# Patient Record
Sex: Female | Born: 1960 | Race: White | Hispanic: No | Marital: Married | Smoking: Never smoker
Health system: Southern US, Community
[De-identification: ages and names within clinical notes are randomized; demographics above are authoritative.]

## PROBLEM LIST (undated history)

## (undated) ENCOUNTER — Emergency Department (HOSPITAL_COMMUNITY): Admission: EM | Payer: Managed Care, Other (non HMO) | Source: Home / Self Care

## (undated) DIAGNOSIS — M1990 Unspecified osteoarthritis, unspecified site: Secondary | ICD-10-CM

## (undated) DIAGNOSIS — N979 Female infertility, unspecified: Secondary | ICD-10-CM

## (undated) DIAGNOSIS — E559 Vitamin D deficiency, unspecified: Secondary | ICD-10-CM

## (undated) DIAGNOSIS — T7840XA Allergy, unspecified, initial encounter: Secondary | ICD-10-CM

## (undated) DIAGNOSIS — N809 Endometriosis, unspecified: Secondary | ICD-10-CM

## (undated) DIAGNOSIS — G43909 Migraine, unspecified, not intractable, without status migrainosus: Secondary | ICD-10-CM

## (undated) HISTORY — DX: Unspecified osteoarthritis, unspecified site: M19.90

## (undated) HISTORY — PX: FOOT SURGERY: SHX648

## (undated) HISTORY — DX: Female infertility, unspecified: N97.9

## (undated) HISTORY — PX: LAPAROSCOPY: SHX197

## (undated) HISTORY — DX: Migraine, unspecified, not intractable, without status migrainosus: G43.909

## (undated) HISTORY — PX: COLONOSCOPY: SHX174

## (undated) HISTORY — DX: Vitamin D deficiency, unspecified: E55.9

## (undated) HISTORY — DX: Allergy, unspecified, initial encounter: T78.40XA

## (undated) HISTORY — DX: Endometriosis, unspecified: N80.9

---

## 1986-11-27 HISTORY — PX: HYDROCELE EXCISION: SHX482

## 1999-03-04 ENCOUNTER — Other Ambulatory Visit: Admission: RE | Admit: 1999-03-04 | Discharge: 1999-03-04 | Payer: Self-pay | Admitting: Obstetrics and Gynecology

## 2000-04-30 ENCOUNTER — Encounter: Payer: Self-pay | Admitting: Obstetrics and Gynecology

## 2000-04-30 ENCOUNTER — Ambulatory Visit (HOSPITAL_COMMUNITY): Admission: RE | Admit: 2000-04-30 | Discharge: 2000-04-30 | Payer: Self-pay | Admitting: Obstetrics and Gynecology

## 2000-06-18 ENCOUNTER — Ambulatory Visit (HOSPITAL_COMMUNITY): Admission: RE | Admit: 2000-06-18 | Discharge: 2000-06-18 | Payer: Self-pay | Admitting: Obstetrics and Gynecology

## 2001-05-22 ENCOUNTER — Encounter: Payer: Self-pay | Admitting: Family Medicine

## 2001-05-22 ENCOUNTER — Encounter: Admission: RE | Admit: 2001-05-22 | Discharge: 2001-05-22 | Payer: Self-pay | Admitting: Family Medicine

## 2002-04-11 ENCOUNTER — Encounter: Admission: RE | Admit: 2002-04-11 | Discharge: 2002-04-11 | Payer: Self-pay | Admitting: Obstetrics and Gynecology

## 2002-04-11 ENCOUNTER — Encounter: Payer: Self-pay | Admitting: Obstetrics and Gynecology

## 2003-07-21 ENCOUNTER — Encounter: Admission: RE | Admit: 2003-07-21 | Discharge: 2003-07-21 | Payer: Self-pay | Admitting: Obstetrics and Gynecology

## 2003-07-21 ENCOUNTER — Encounter: Payer: Self-pay | Admitting: Obstetrics and Gynecology

## 2003-07-23 ENCOUNTER — Other Ambulatory Visit: Admission: RE | Admit: 2003-07-23 | Discharge: 2003-07-23 | Payer: Self-pay | Admitting: Obstetrics and Gynecology

## 2003-08-06 ENCOUNTER — Encounter: Admission: RE | Admit: 2003-08-06 | Discharge: 2003-08-06 | Payer: Self-pay | Admitting: Obstetrics and Gynecology

## 2003-08-06 ENCOUNTER — Encounter: Payer: Self-pay | Admitting: Obstetrics and Gynecology

## 2004-03-04 ENCOUNTER — Encounter: Admission: RE | Admit: 2004-03-04 | Discharge: 2004-03-04 | Payer: Self-pay | Admitting: Obstetrics and Gynecology

## 2004-08-25 ENCOUNTER — Other Ambulatory Visit: Admission: RE | Admit: 2004-08-25 | Discharge: 2004-08-25 | Payer: Self-pay | Admitting: Obstetrics and Gynecology

## 2004-09-02 ENCOUNTER — Encounter: Admission: RE | Admit: 2004-09-02 | Discharge: 2004-09-02 | Payer: Self-pay | Admitting: Obstetrics and Gynecology

## 2005-05-29 ENCOUNTER — Ambulatory Visit: Payer: Self-pay | Admitting: Family Medicine

## 2005-10-27 ENCOUNTER — Other Ambulatory Visit: Admission: RE | Admit: 2005-10-27 | Discharge: 2005-10-27 | Payer: Self-pay | Admitting: Obstetrics and Gynecology

## 2006-01-26 ENCOUNTER — Ambulatory Visit (HOSPITAL_COMMUNITY): Admission: RE | Admit: 2006-01-26 | Discharge: 2006-01-26 | Payer: Self-pay | Admitting: Obstetrics and Gynecology

## 2006-06-25 ENCOUNTER — Ambulatory Visit: Payer: Self-pay | Admitting: Family Medicine

## 2007-08-09 DIAGNOSIS — J309 Allergic rhinitis, unspecified: Secondary | ICD-10-CM | POA: Insufficient documentation

## 2008-03-23 ENCOUNTER — Encounter: Payer: Self-pay | Admitting: Family Medicine

## 2012-02-21 LAB — HM PAP SMEAR: HM Pap smear: NEGATIVE

## 2012-02-23 ENCOUNTER — Other Ambulatory Visit: Payer: Self-pay | Admitting: Obstetrics and Gynecology

## 2012-02-23 ENCOUNTER — Other Ambulatory Visit: Payer: Self-pay | Admitting: Certified Nurse Midwife

## 2012-02-23 DIAGNOSIS — E041 Nontoxic single thyroid nodule: Secondary | ICD-10-CM

## 2012-02-23 DIAGNOSIS — Z1231 Encounter for screening mammogram for malignant neoplasm of breast: Secondary | ICD-10-CM

## 2012-02-27 ENCOUNTER — Ambulatory Visit
Admission: RE | Admit: 2012-02-27 | Discharge: 2012-02-27 | Disposition: A | Discharge status: Home / Self Care | Payer: BC Managed Care – PPO | Source: Ambulatory Visit | Attending: Certified Nurse Midwife | Admitting: Certified Nurse Midwife

## 2012-02-27 DIAGNOSIS — E041 Nontoxic single thyroid nodule: Secondary | ICD-10-CM

## 2012-03-19 ENCOUNTER — Ambulatory Visit: Payer: Self-pay

## 2012-04-09 ENCOUNTER — Ambulatory Visit: Payer: Self-pay

## 2012-04-26 ENCOUNTER — Ambulatory Visit
Admission: RE | Admit: 2012-04-26 | Discharge: 2012-04-26 | Disposition: A | Discharge status: Home / Self Care | Payer: BC Managed Care – PPO | Source: Ambulatory Visit | Attending: Obstetrics and Gynecology | Admitting: Obstetrics and Gynecology

## 2012-04-26 DIAGNOSIS — Z1231 Encounter for screening mammogram for malignant neoplasm of breast: Secondary | ICD-10-CM

## 2012-10-10 ENCOUNTER — Telehealth: Payer: Self-pay | Admitting: *Deleted

## 2012-10-10 NOTE — Telephone Encounter (Signed)
Pt reports for 3 weeks, she has experienced diarrhea at first, but now her stools are very thin/liquid like, almost watery; she has not seen any blood. She states this is an abrupt change for her. She reports a "pain/discomfort/gurgling" in her stomach after eating and sometimes urgency and bloating. This feeling is below her belly button in the middle of her stomach. I asked if she needs to be seen by an extender or can she wait until next week and she stated she can wait, she's just worried because it is a sudden change for her. Pt given an appt on 10/17/12 to see Dr Rhea Belton.

## 2012-10-10 NOTE — Telephone Encounter (Signed)
lmom for pt to call back. She requests an appt for a change in bowel habits and abdominal pain.

## 2012-10-16 ENCOUNTER — Encounter: Payer: Self-pay | Admitting: Internal Medicine

## 2012-10-17 ENCOUNTER — Encounter: Payer: Self-pay | Admitting: Internal Medicine

## 2012-10-17 ENCOUNTER — Ambulatory Visit (INDEPENDENT_AMBULATORY_CARE_PROVIDER_SITE_OTHER): Payer: BC Managed Care – PPO | Admitting: Internal Medicine

## 2012-10-17 ENCOUNTER — Other Ambulatory Visit (INDEPENDENT_AMBULATORY_CARE_PROVIDER_SITE_OTHER): Payer: BC Managed Care – PPO

## 2012-10-17 VITALS — BP 106/74 | HR 72 | Ht 64.25 in | Wt 168.1 lb

## 2012-10-17 DIAGNOSIS — R194 Change in bowel habit: Secondary | ICD-10-CM

## 2012-10-17 DIAGNOSIS — R198 Other specified symptoms and signs involving the digestive system and abdomen: Secondary | ICD-10-CM

## 2012-10-17 DIAGNOSIS — Z8669 Personal history of other diseases of the nervous system and sense organs: Secondary | ICD-10-CM | POA: Insufficient documentation

## 2012-10-17 DIAGNOSIS — R197 Diarrhea, unspecified: Secondary | ICD-10-CM

## 2012-10-17 DIAGNOSIS — R109 Unspecified abdominal pain: Secondary | ICD-10-CM

## 2012-10-17 LAB — COMPREHENSIVE METABOLIC PANEL
AST: 15 U/L (ref 0–37)
Albumin: 4.4 g/dL (ref 3.5–5.2)
Alkaline Phosphatase: 62 U/L (ref 39–117)
Glucose, Bld: 91 mg/dL (ref 70–99)
Potassium: 4.4 mEq/L (ref 3.5–5.1)
Sodium: 138 mEq/L (ref 135–145)
Total Protein: 7.6 g/dL (ref 6.0–8.3)

## 2012-10-17 LAB — CBC WITH DIFFERENTIAL/PLATELET
Eosinophils Relative: 2.3 % (ref 0.0–5.0)
Lymphocytes Relative: 32.9 % (ref 12.0–46.0)
MCV: 95.2 fl (ref 78.0–100.0)
Monocytes Absolute: 0.5 10*3/uL (ref 0.1–1.0)
Neutrophils Relative %: 55.7 % (ref 43.0–77.0)
Platelets: 223 10*3/uL (ref 150.0–400.0)
WBC: 5.2 10*3/uL (ref 4.5–10.5)

## 2012-10-17 LAB — TSH: TSH: 1.68 u[IU]/mL (ref 0.35–5.50)

## 2012-10-17 MED ORDER — DIPHENOXYLATE-ATROPINE 2.5-0.025 MG PO TABS: 1.0000 | ORAL_TABLET | Freq: Four times a day (QID) | ORAL | Status: DC | PRN

## 2012-10-17 MED ORDER — PEG-KCL-NACL-NASULF-NA ASC-C 100 G PO SOLR: 1.0000 | Freq: Once | ORAL | Status: DC

## 2012-10-17 NOTE — Patient Instructions (Addendum)
You have been scheduled for a colonoscopy with propofol. Please follow written instructions given to you at your visit today.  Please pick up your prep kit at the pharmacy within the next 1-3 days. If you use inhalers (even only as needed) or a CPAP machine, please bring them with you on the day of your procedure.  We have sent the following medications to your pharmacy for you to pick up at your convenience: moviprep, lomotil  Your physician has requested that you go to the basement for the following lab work before leaving today: Stool studies, CBC, CMP, TSH, Celiac

## 2012-10-17 NOTE — Progress Notes (Signed)
Patient ID: Lisa Rice, female   DOB: 10-May-1961, 51 y.o.   MRN: [id number]  SUBJECTIVE: HPI Lisa Rice is a 51 yo female with PMH of migraines who is seen in clinic for evaluation of change in bowel habits and loose stools. The patient is alone today. She reports in mid October she had what is now felt to be an abrupt change in bowel habits. Initially she thought very little other change and felt it might be secondary to a viral infection. Prior to this time her bowel habits have always been regular occurring daily to every other day. She denies a history of constipation or diarrhea. Since mid-October she has noted her stools to be loose and at times watery. She also reports they have been "thinner than normal". She has occasional lower abdominal cramping which is relieved by defecation. She is having bowel movements 3-4 times daily. She feels that they have a different and stronger odor. She's also noted some abdominal bloating. She denies rectal bleeding and melena. No nausea or vomiting. Appetite has been good. Sleep has been poor lately and she has been worried about this change. Menstrual cycles have been regular. No mouth ulcers, joint pains, rashes. No travel outside of the [place], no camping or well water.  Review of Systems  As per history of present illness, otherwise negative   Past Medical History  Diagnosis Date  . Migraine headache     Current Outpatient Prescriptions  Medication Sig Dispense Refill  . CALCIUM MAGNESIUM 750 PO Take 1 tablet by mouth daily.      Marland Kitchen KETOPROFEN PO Take 1 tablet by mouth as needed.      . loratadine-pseudoephedrine (EQ ALLERGY RELIEF D) 10-240 MG per 24 hr tablet Take 1 tablet by mouth daily.      Marland Kitchen topiramate (TOPAMAX) 25 MG tablet Take 75 mg by mouth daily.      . diphenoxylate-atropine (LOMOTIL) 2.5-0.025 MG per tablet Take 1 tablet by mouth 4 (four) times daily as needed for diarrhea or loose stools.  30 tablet  0  . peg 3350 powder (MOVIPREP) 100 G  SOLR Take 1 kit (100 g total) by mouth once.  1 kit  0    No Known Allergies  Family History  Problem Relation Age of Onset  . Breast cancer Mother   . Prostate cancer Father   . Uterine cancer Sister   . Kidney cancer Sister     History  Substance Use Topics  . Smoking status: Never Smoker   . Smokeless tobacco: Never Used  . Alcohol Use: Yes     Comment: occas- social    OBJECTIVE: BP 106/74  Pulse 72  Ht 5' 4.25" (1.632 m)  Wt 168 lb 2 oz (76.261 kg)  BMI 28.63 kg/m2  LMP 09/26/2012 Constitutional: Well-developed and well-nourished. No distress. HEENT: Normocephalic and atraumatic. Oropharynx is clear and moist. No oropharyngeal exudate. Conjunctivae are normal. No scleral icterus. Neck: Neck supple. Trachea midline. Cardiovascular: Normal rate, regular rhythm and intact distal pulses. No M/R/G Pulmonary/chest: Effort normal and breath sounds normal. No wheezing, rales or rhonchi. Abdominal: Soft, nontender, nondistended. Bowel sounds active throughout. There are no masses palpable. No hepatosplenomegaly. Extremities: no clubbing, cyanosis, or edema Lymphadenopathy: No cervical adenopathy noted. Neurological: Alert and oriented to person place and time. Skin: Skin is warm and dry. No rashes noted. Psychiatric: Normal mood and affect. Behavior is normal.   ASSESSMENT AND PLAN: 51 yo female with PMH of migraines who is seen  in clinic for evaluation of change in bowel habits and loose stools.  1.  Change in bowel habits -- I've recommended stool studies to include C. difficile, ova and parasite, fecal leukocytes, and stool culture. Also will check labs today to include TSH, celiac panel, CMP and CBC. If these are unrevealing I recommended direct visualization with colonoscopy. We discussed the test including the risks and benefits and she is agreeable to proceed. I will give her prescription for Lomotil to be used as directed and as needed for loose stools and diarrhea.  She was offered an anti-spasmodic, but does not feel this is necessary at this time. Further recommendations to be made after lab studies and colonoscopy.

## 2012-10-21 ENCOUNTER — Other Ambulatory Visit: Payer: BC Managed Care – PPO

## 2012-10-21 DIAGNOSIS — R194 Change in bowel habit: Secondary | ICD-10-CM

## 2012-10-21 DIAGNOSIS — R109 Unspecified abdominal pain: Secondary | ICD-10-CM

## 2012-10-22 LAB — CLOSTRIDIUM DIFFICILE BY PCR: Toxigenic C. Difficile by PCR: NOT DETECTED

## 2012-10-23 ENCOUNTER — Ambulatory Visit (AMBULATORY_SURGERY_CENTER): Payer: BC Managed Care – PPO | Admitting: Internal Medicine

## 2012-10-23 ENCOUNTER — Encounter: Payer: Self-pay | Admitting: Internal Medicine

## 2012-10-23 VITALS — BP 110/60 | HR 63 | Temp 97.8°F | Resp 16 | Ht 64.0 in | Wt 168.0 lb

## 2012-10-23 DIAGNOSIS — D126 Benign neoplasm of colon, unspecified: Secondary | ICD-10-CM

## 2012-10-23 DIAGNOSIS — R109 Unspecified abdominal pain: Secondary | ICD-10-CM

## 2012-10-23 DIAGNOSIS — R198 Other specified symptoms and signs involving the digestive system and abdomen: Secondary | ICD-10-CM

## 2012-10-23 DIAGNOSIS — R194 Change in bowel habit: Secondary | ICD-10-CM

## 2012-10-23 DIAGNOSIS — R195 Other fecal abnormalities: Secondary | ICD-10-CM

## 2012-10-23 MED ORDER — SODIUM CHLORIDE 0.9 % IV SOLN: 500.0000 mL | INTRAVENOUS | Status: DC

## 2012-10-23 NOTE — Progress Notes (Signed)
Patient did not experience any of the following events: a burn prior to discharge; a fall within the facility; wrong site/side/patient/procedure/implant event; or a hospital transfer or hospital admission upon discharge from the facility. (G8907) Patient did not have preoperative order for IV antibiotic SSI prophylaxis. (G8918)  

## 2012-10-23 NOTE — Op Note (Signed)
[facility] [address] [place], [address]   COLONOSCOPY PROCEDURE REPORT  PATIENT: Lisa, Rice  MR#: [id number] BIRTHDATE: 08-28-1961 , 51  yrs. old GENDER: Female ENDOSCOPIST: Beverley Fiedler, MD PROCEDURE DATE:  10/23/2012 PROCEDURE:   Colonoscopy with biopsy ASA CLASS:   Class I INDICATIONS:Change in bowel habits and unexplained loose stools. MEDICATIONS: MAC sedation, administered by CRNA and propofol (Diprivan) 500mg  IV  DESCRIPTION OF PROCEDURE:   After the risks benefits and alternatives of the procedure were thoroughly explained, informed consent was obtained.  A digital rectal exam revealed no rectal mass.   The LB CF-[id number]  endoscope was introduced through the anus and advanced to the terminal ileum which was intubated for a short distance. No adverse events experienced.   The quality of the prep was good, using MoviPrep  The instrument was then slowly withdrawn as the colon was fully examined.    COLON FINDINGS: The mucosa appeared normal in the terminal ileum. A normal appearing cecum, ileocecal valve, and appendiceal orifice were identified.  The ascending, hepatic flexure, transverse, splenic flexure, descending, sigmoid colon and rectum appeared unremarkable.  No polyps or cancers were seen.  Multiple random biopsies were performed throughout the colon given loose stools. Retroflexed views revealed no abnormalities. The time to cecum=5 minutes 22 seconds.  Withdrawal time=14 minutes 55 seconds.  The scope was withdrawn and the procedure completed.  COMPLICATIONS: There were no complications.  ENDOSCOPIC IMPRESSION: 1.   Normal mucosa in the terminal ileum 2.   Normal colon; multiple random biopsies performed  RECOMMENDATIONS: 1.  Await pathology results 2.  Trial of Align 1 capsule daily   eSigned:  Beverley Fiedler, MD 10/23/2012 8:20 AM   cc: Nelwyn Salisbury, MD and The Patient

## 2012-10-23 NOTE — Patient Instructions (Signed)
Normal colonoscopy Recommendations:  Trial of Align 1 capsule daily  YOU HAD AN ENDOSCOPIC PROCEDURE TODAY AT THE [facility]: Refer to the procedure report that was given to you for any specific questions about what was found during the examination.  If the procedure report does not answer your questions, please call your gastroenterologist to clarify.  If you requested that your care partner not be given the details of your procedure findings, then the procedure report has been included in a sealed envelope for you to review at your convenience later.  YOU SHOULD EXPECT: Some feelings of bloating in the abdomen. Passage of more gas than usual.  Walking can help get rid of the air that was put into your GI tract during the procedure and reduce the bloating. If you had a lower endoscopy (such as a colonoscopy or flexible sigmoidoscopy) you may notice spotting of blood in your stool or on the toilet paper. If you underwent a bowel prep for your procedure, then you may not have a normal bowel movement for a few days.  DIET: Your first meal following the procedure should be a light meal and then it is ok to progress to your normal diet.  A half-sandwich or bowl of soup is an example of a good first meal.  Heavy or fried foods are harder to digest and may make you feel nauseous or bloated.  Likewise meals heavy in dairy and vegetables can cause extra gas to form and this can also increase the bloating.  Drink plenty of fluids but you should avoid alcoholic beverages for 24 hours.  ACTIVITY: Your care partner should take you home directly after the procedure.  You should plan to take it easy, moving slowly for the rest of the day.  You can resume normal activity the day after the procedure however you should NOT DRIVE or use heavy machinery for 24 hours (because of the sedation medicines used during the test).    SYMPTOMS TO REPORT IMMEDIATELY: A gastroenterologist can be reached at any hour.   During normal business hours, 8:30 AM to 5:00 PM Monday through Friday, call [phone].  After hours and on weekends, please call the GI answering service at [phone] who will take a message and have the physician on call contact you.   Following lower endoscopy (colonoscopy or flexible sigmoidoscopy):  Excessive amounts of blood in the stool  Significant tenderness or worsening of abdominal pains  Swelling of the abdomen that is new, acute  Fever of 100F or higher  Following upper endoscopy (EGD)  Vomiting of blood or coffee ground material  New chest pain or pain under the shoulder blades  Painful or persistently difficult swallowing  New shortness of breath  Fever of 100F or higher  Black, tarry-looking stools  FOLLOW UP: If any biopsies were taken you will be contacted by phone or by letter within the next 1-3 weeks.  Call your gastroenterologist if you have not heard about the biopsies in 3 weeks.  Our staff will call the home number listed on your records the next business day following your procedure to check on you and address any questions or concerns that you may have at that time regarding the information given to you following your procedure. This is a courtesy call and so if there is no answer at the home number and we have not heard from you through the emergency physician on call, we will assume that you have returned to your  regular daily activities without incident.  SIGNATURES/CONFIDENTIALITY: You and/or your care partner have signed paperwork which will be entered into your electronic medical record.  These signatures attest to the fact that that the information above on your After Visit Summary has been reviewed and is understood.  Full responsibility of the confidentiality of this discharge information lies with you and/or your care-partner.   Please follow all discharge instructions given to you by the recovery room nurse. If you have any questions or  problems after discharge please call one of the numbers listed above. You will receive a phone call in the am to see how you are doing and answer any questions you may have. Thank you for choosing [facility] for your health care needs.  

## 2012-10-28 ENCOUNTER — Telehealth: Payer: Self-pay | Admitting: *Deleted

## 2012-10-28 NOTE — Telephone Encounter (Signed)
  Follow up Call-  Call back number 10/23/2012  Post procedure Call Back phone  # cell [phone]  Permission to leave phone message Yes     Patient questions:  Do you have a fever, pain , or abdominal swelling? no Pain Score  0 *  Have you tolerated food without any problems? yes  Have you been able to return to your normal activities? yes  Do you have any questions about your discharge instructions: Diet   no Medications  no Follow up visit  no  Do you have questions or concerns about your Care? no  Actions: * If pain score is 4 or above: No action needed, pain <4.

## 2012-11-04 ENCOUNTER — Encounter: Payer: Self-pay | Admitting: Internal Medicine

## 2012-11-08 ENCOUNTER — Telehealth: Payer: Self-pay | Admitting: Genetic Counselor

## 2012-11-08 NOTE — Telephone Encounter (Signed)
S/W referring office in re genetic appt 12/23 @ 1o w/,Karen Lowell [employer]uitar.  Welcome packet mailed.

## 2012-11-18 ENCOUNTER — Ambulatory Visit (HOSPITAL_BASED_OUTPATIENT_CLINIC_OR_DEPARTMENT_OTHER): Payer: BC Managed Care – PPO | Admitting: Genetic Counselor

## 2012-11-18 ENCOUNTER — Encounter: Payer: Self-pay | Admitting: Genetic Counselor

## 2012-11-18 ENCOUNTER — Other Ambulatory Visit: Payer: BC Managed Care – PPO | Admitting: Lab

## 2012-11-18 DIAGNOSIS — Z803 Family history of malignant neoplasm of breast: Secondary | ICD-10-CM

## 2012-11-18 DIAGNOSIS — Z8049 Family history of malignant neoplasm of other genital organs: Secondary | ICD-10-CM

## 2012-11-18 DIAGNOSIS — IMO0002 Reserved for concepts with insufficient information to code with codable children: Secondary | ICD-10-CM

## 2012-11-18 NOTE — Progress Notes (Signed)
Lisa Rice, CNW requested a consultation for genetic counseling and risk assessment for Lisa Rice, a 51 y.o. female, for discussion of her family history of breast, prostate, kidney and uterine cancer. She presents to clinic today to discuss the possibility of a genetic predisposition to cancer, and to further clarify her risks, as well as her family members' risks for cancer.   HISTORY OF PRESENT ILLNESS: Lisa Rice is a 51 y.o. female with no personal history of cancer.    Past Medical History  Diagnosis Date  . Migraine headache     Past Surgical History  Procedure Date  . Hydrocele excision 1988    hernia, right lower abd  . Laparoscopy     History  Substance Use Topics  . Smoking status: Never Smoker   . Smokeless tobacco: Never Used  . Alcohol Use: Yes     Comment: occas- social    REPRODUCTIVE HISTORY AND PERSONAL RISK ASSESSMENT FACTORS: Menarche was at age 58-14.   Premenopausal Uterus Intact: Yes Ovaries Intact: only has one ovary G0P0A0 , first live birth at age N/A  She has previously undergone treatment for infertility.   OCP use for 9 years   She has not used HRT in the past.   Colonoscopy at age 41 was negative for polyps, diverticulitis or malignancy.  FAMILY HISTORY:  We obtained a detailed, 4-generation family history.  Significant diagnoses are listed below: Family History  Problem Relation Age of Onset  . Breast cancer Mother 25  . Prostate cancer Father 22  . Uterine cancer Sister 39  . Kidney cancer Sister 40  The patient was diagnosed with thyroid nodules at the age of 64. The patient has nine full brothers and sisters and one paternal half brother.  Her oldest sister was diagnosed with both uterine and kidney cancer at the same time at age 62.  One brother committed suicide at age 5.  The patients father was diagnosed with prostate cancer at age 77 and died at age 12 from metastasizes.  The patient's mother was diagnosed with breast  cancer at age 46 and died at 74.  She has three brothers and a sister who died in their late 25s and 49s without cancer.  There is no other reported cancer history on either side of the family.  Patient's maternal ancestors are of [place] descent, and paternal ancestors are of [place] descent. There is no reported Ashkenazi Jewish ancestry. There is no  known consanguinity.  GENETIC COUNSELING RISK ASSESSMENT, DISCUSSION, AND SUGGESTED FOLLOW UP: We reviewed the natural history and genetic etiology of sporadic, familial and hereditary cancer syndromes.  About 5-10% of breast cancer is hereditary.  Of this, about 85% is the result of a BRCA1 or BRCA2 mutation.  We reviewed the red flags of hereditary cancer syndromes and the dominant inheritance patterns.  If the BRCA testing is negative, we discussed that we could be testing for the wrong gene.  We discussed gene panels, and that several cancer genes that are associated with different cancers can be tested at the same time.  Because of the different types of cancer that are in the patient's family, we will consider the [facility] panel testing.   The patient's family history of breast, prostate, uterine and kidney cancer is suggestive of the following possible diagnosis: hereditary cancer syndrome  We discussed that identification of a hereditary cancer syndrome may help her care providers tailor the patients medical management. If a mutation indicating a  hereditary cancer syndroem is detected in this case, the [employer] recommendations would include increased cancer surveillance and possible prophylactic surgery. If a mutation is detected, the patient will be referred back to the referring provider and to any additional appropriate care providers to discuss the relevant options.   If a mutation is not found in the patient, cancer surveillance options would be discussed for the patient according to the appropriate  standard National Comprehensive Cancer Network and American Cancer Society guidelines, with consideration of their personal and family history risk factors. In this case, the patient will be referred back to their care providers for discussions of management.   In order to estimate her chance of having a BRCA mutation, we used statistical models (BRACAPro, Tyrer Cusik and PENN II) and laboratory data that take into account her personal medical history, family history and ancestry.  Because each model is different, there can be a lot of variability in the risks they give.  Therefore, these numbers must be considered a rough range and not a precise risk of having a BRCA mutation.  These models estimate that she has approximately a 0.03-8% chance of having a mutation. Based on this assessment of her family and personal history, genetic testing is recommended.  Based on the patient's personal and family history, statistical models (BRCAPro and [employer]) and literature data were used to estimate her risk of developing breast cancer. These estimate her lifetime risk of developing breast cancer to be approximately 10.5% to 19.2%. This estimation does not take into account any genetic testing results.   After considering the risks, benefits, and limitations, the patient provided informed consent for  the following  testing: BRACAnalysis with My[employer] through [employer].   Per the patient's request, we will contact her by telephone to discuss these results. A follow up genetic counseling visit will be scheduled if indicated.  The patient was seen for a total of 60 minutes, greater than 50% of which was spent face-to-face counseling.  This plan is being carried out per Lisa Rice, CNW recommendations.  This note will also be sent to the referring provider via the electronic medical record. The patient will be supplied with a summary of this genetic counseling discussion as well as educational information  on the discussed hereditary cancer syndromes following the conclusion of their visit.   Patient was discussed with Dr. Pierce Crane in Dr. Feliz Beam absence.   _______________________________________________________________________ For Office Staff:  Number of people involved in session: 2 Was an Intern/ student involved with case: no

## 2012-12-09 ENCOUNTER — Telehealth: Payer: Self-pay | Admitting: Genetic Counselor

## 2012-12-09 NOTE — Telephone Encounter (Signed)
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

## 2012-12-25 ENCOUNTER — Encounter: Payer: Self-pay | Admitting: Genetic Counselor

## 2013-01-13 ENCOUNTER — Encounter: Payer: Self-pay | Admitting: Certified Nurse Midwife

## 2013-02-25 ENCOUNTER — Ambulatory Visit: Payer: Self-pay | Admitting: Certified Nurse Midwife

## 2013-03-03 ENCOUNTER — Ambulatory Visit: Payer: Self-pay | Admitting: Certified Nurse Midwife

## 2013-03-13 ENCOUNTER — Encounter: Payer: Self-pay | Admitting: Certified Nurse Midwife

## 2013-03-13 ENCOUNTER — Ambulatory Visit (INDEPENDENT_AMBULATORY_CARE_PROVIDER_SITE_OTHER): Payer: BC Managed Care – PPO | Admitting: Certified Nurse Midwife

## 2013-03-13 VITALS — BP 102/60 | Ht 64.5 in | Wt 179.0 lb

## 2013-03-13 DIAGNOSIS — Z01419 Encounter for gynecological examination (general) (routine) without abnormal findings: Secondary | ICD-10-CM

## 2013-03-13 DIAGNOSIS — Z0000 Encounter for general adult medical examination without abnormal findings: Secondary | ICD-10-CM

## 2013-03-13 LAB — POCT URINALYSIS DIPSTICK
Bilirubin, UA: NEGATIVE
Glucose, UA: NEGATIVE
Ketones, UA: NEGATIVE
Leukocytes, UA: NEGATIVE

## 2013-03-13 NOTE — Progress Notes (Signed)
52 y.o. MarriedCaucasian female   G0P0 here for annual exam. Periods normal.  No changes.  Continues follow up with neurologist with migraine headache management.  Contraception now, long history of infertility. Working out now in gym to help with weight control.  No health concerns today  Patient's last menstrual period was 02/28/2013.          Sexually active: yes  The current method of family planning is none.    Exercising: yes  Gym/ health club routine includes cardio. Last mammogram: 04/26/12 normal Last pap: 02/21/12 neg Last BMD: never Alcohol: occ beer and wine Tobacco: quit 15 years ago Colonoscopy 11-13 negative, 10 years   Health Maintenance  Topic Date Due  . Influenza Vaccine  07/28/2013  . Pap Smear  02/21/2015  . Tetanus/tdap  02/20/2022    Family History  Problem Relation Age of Onset  . Breast cancer Mother 63  . Prostate cancer Father 37  . Uterine cancer Sister 64  . Kidney cancer Sister 20    Patient Active Problem List  Diagnosis  . ALLERGIC RHINITIS  . Hx of migraines    Past Medical History  Diagnosis Date  . Migraine headache   . Endometriosis   . Infertility, female     IVF 12 years ago    Past Surgical History  Procedure Laterality Date  . Hydrocele excision  1988    hernia, right lower abd  . Laparoscopy      removed left ovary    Allergies: Review of patient's allergies indicates no known allergies.  Current Outpatient Prescriptions  Medication Sig Dispense Refill  . KETOPROFEN PO Take 1 tablet by mouth as needed.      . loratadine-pseudoephedrine (EQ ALLERGY RELIEF D) 10-240 MG per 24 hr tablet Take 1 tablet by mouth daily.      Marland Kitchen topiramate (TOPAMAX) 25 MG tablet Take 75 mg by mouth daily.      Marland Kitchen zolmitriptan (ZOMIG) 5 MG nasal solution Place into the nose as needed for migraine.       No current facility-administered medications for this visit.    ROS: Pertinent items are noted in HPI.  Exam:    BP 102/60  Ht 5' 4.5"  (1.638 m)  Wt 179 lb (81.194 kg)  BMI 30.26 kg/m2  LMP 02/28/2013 Weight change: @WEIGHTCHANGE @ Last 3 height recordings:  Ht Readings from Last 3 Encounters:  03/13/13 5' 4.5" (1.638 m)  10/23/12 5\' 4"  (1.626 m)  10/17/12 5' 4.25" (1.632 m)   General appearance: alert, cooperative and appears stated age Head: Normocephalic, without obvious abnormality, atraumatic Neck: no adenopathy, supple, symmetrical, trachea midline and thyroid not enlarged, symmetric, no tenderness/mass/nodules Lungs: clear to auscultation bilaterally Breasts: normal appearance, no masses or tenderness, No nipple retraction or dimpling Heart: regular rate and rhythm Abdomen: soft, non-tender; bowel sounds normal; no masses,  no organomegaly Extremities: extremities normal, atraumatic, no cyanosis or edema Skin: Skin color, texture, turgor normal. No rashes or lesions Lymph nodes: Cervical, supraclavicular, and axillary nodes normal. no inguinal nodes palpated Neurologic: Alert and oriented X 3, normal strength and tone. Normal symmetric reflexes. Normal coordination and gait   Pelvic: External genitalia:  no lesions and well estrogenized              Urethra: normal appearing urethra with no masses, tenderness or lesions              Bartholins and Skenes: Bartholin's, Urethra, Skene's normal  Vagina: normal appearing vagina with normal color and discharge, no lesions              Cervix: normal appearance              Pap taken: yes        Bimanual Exam:  Uterus:  uterus is normal size, shape, consistency and nontender, mid position                                      Adnexa:    normal adnexa in size, nontender and no masses                                      Rectovaginal: Confirms                                      Anus:  normal sphincter tone, no lesions  A: Well woman exam Contraception not needed  Infertility History of migraine headache stable medication     P:Reviewed health and  wellness pertinent to exam  Mammogram yearly pap smear as indicated Continue follow as indicated  return annually or prn      An After Visit Summary was printed and given to the patient.   Reviewed, TL

## 2013-03-13 NOTE — Patient Instructions (Addendum)

## 2013-12-12 ENCOUNTER — Ambulatory Visit (INDEPENDENT_AMBULATORY_CARE_PROVIDER_SITE_OTHER): Payer: BC Managed Care – PPO

## 2013-12-12 ENCOUNTER — Ambulatory Visit (INDEPENDENT_AMBULATORY_CARE_PROVIDER_SITE_OTHER): Payer: BC Managed Care – PPO | Admitting: Podiatrist

## 2013-12-12 ENCOUNTER — Encounter: Payer: Self-pay | Admitting: Podiatrist

## 2013-12-12 VITALS — BP 105/59 | HR 61 | Resp 18

## 2013-12-12 DIAGNOSIS — D219 Benign neoplasm of connective and other soft tissue, unspecified: Secondary | ICD-10-CM

## 2013-12-12 DIAGNOSIS — M6740 Ganglion, unspecified site: Secondary | ICD-10-CM

## 2013-12-12 DIAGNOSIS — M79609 Pain in unspecified limb: Secondary | ICD-10-CM

## 2013-12-12 DIAGNOSIS — D3610 Benign neoplasm of peripheral nerves and autonomic nervous system, unspecified: Secondary | ICD-10-CM

## 2013-12-12 DIAGNOSIS — M7150 Other bursitis, not elsewhere classified, unspecified site: Secondary | ICD-10-CM

## 2013-12-12 NOTE — Progress Notes (Signed)
   Subjective:    Patient ID: Lisa Rice, female    DOB: 1961/10/11, 53 y.o.   MRN: [id number]  HPI both feet and I notice inbetween my 3rd and 4th toe and there is sharp pain and feels like they bend but they cant and been going on for 2 months and hurts anytime     Review of Systems  Constitutional: Negative.   HENT: Negative.   Eyes: Negative.   Respiratory: Negative.   Cardiovascular: Negative.   Gastrointestinal: Negative.   Endocrine: Negative.   Genitourinary: Negative.   Musculoskeletal:       Difficulty walking  Skin: Negative.   Neurological: Negative.   Hematological: Negative.   Psychiatric/Behavioral: Negative.        Objective:   Physical Exam GENERAL APPEARANCE: Alert, conversant. Appropriately groomed. No acute distress.  VASCULAR: Pedal pulses palpable and strong bilateral.  Capillary refill time is immediate to all digits,  Proximal to distal cooling it warm to warm.  Digital hair growth is present bilateral  NEUROLOGIC: sensation is intact epicritically and protectively to 5.07 monofilament at 5/5 sites bilateral.  Light touch is intact bilateral, vibratory sensation intact bilateral, achilles tendon reflex is intact bilateral. Neuroma type symptomotology noted 3rd interspace bilateral feet.  No palpapable click appreciated bilateral.  MUSCULOSKELETAL: acceptable muscle strength, tone and stability bilateral. Swelling noted 3rd interspace bilateral.  Splaying of the digits seen 3/4 bilateral DERMATOLOGIC: isolated swelling identified at the 3rd interspace bilaterally.  It does appear to be localized swelling versus cyst in the area.         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  Kenalog and marcaine mixture was infiltrated.  Patient tolerated this well.  i will see her back in 3-4 weeks for follow up and to see how effective the  injection was for her.  If any concerns arise she is to call.

## 2013-12-12 NOTE — Patient Instructions (Signed)
Morton's Neuroma Neuralgia (nerve pain) or neuroma (benign [non-cancerous] nerve tumor) may develop on any interdigital nerve. The interdigital nerves (nerves between digits) of the foot travel beneath and between the metatarsals (long bones of the fore foot) and pass the nerve endings to the toes. The third interdigital is a common place for a small neuroma to form called Morton's neuroma. Another nerve to be affected commonly is the fourth interdigital nerve. This would be in approximately in the area of the base or ball under the bottom of your fourth toe. This condition occurs more commonly in women and is usually on one side. It is usually first noticed by pain radiating (spreading) to the ball of the foot or to the toes. CAUSES The cause of interdigital neuralgia may be from low grade repetitive trauma (damage caused by an accident) as in activities causing a repeated pounding of the foot (running, jumping etc.). It is also caused by improper footwear or recent loss of the fatty padding on the bottom of the foot. TREATMENT  The condition often resolves (goes away) simply with decreasing activity if that is thought to be the cause. Proper shoes are beneficial. Orthotics (special foot support aids) such as a metatarsal bar are often beneficial. This condition usually responds to conservative therapy, however if surgery is necessary it usually brings complete relief. HOME CARE INSTRUCTIONS   Apply ice to the area of soreness for 15-20 minutes, 03-04 times per day, while awake for the first 2 days. Put ice in a plastic bag and place a towel between the bag of ice and your skin.  Only take over-the-counter or prescription medicines for pain, discomfort, or fever as directed by your caregiver. MAKE SURE YOU:   Understand these instructions.  Will watch your condition.  Will get help right away if you are not doing well or get worse. Document Released: 02/19/2001 Document Revised: 02/05/2012  Document Reviewed: 11/13/2005 [employer] Patient Information 2014 [employer].  

## 2014-01-09 ENCOUNTER — Ambulatory Visit (INDEPENDENT_AMBULATORY_CARE_PROVIDER_SITE_OTHER): Payer: BC Managed Care – PPO | Admitting: Podiatrist

## 2014-01-09 ENCOUNTER — Encounter: Payer: Self-pay | Admitting: Podiatrist

## 2014-01-09 VITALS — BP 104/67 | HR 64 | Resp 12

## 2014-01-09 DIAGNOSIS — M6740 Ganglion, unspecified site: Secondary | ICD-10-CM

## 2014-01-09 DIAGNOSIS — M7150 Other bursitis, not elsewhere classified, unspecified site: Secondary | ICD-10-CM

## 2014-01-09 NOTE — Progress Notes (Deleted)
''   BOTH FEET ARE STILL HURTING AND THE TOES STILL FEEL DULL PAIN.''

## 2014-01-09 NOTE — Progress Notes (Signed)
   Subjective:  Lisa Rice presents today for follow up of pain 3rd interspace bilateral.  At the last visit we performed a cyst drainage and a steroid injection bilateral feet.  She relates improvement but not complete resolvement of symptoms.  She relates a dull pain bilateral 3rd interspaces. And movement of the discomfort to the toes themselves.    Objective:  Neurovascular status unchanged and intact.  Dull discomfort is present 3rd interspace bilateral.  Palpable fullness and inflammation in the interspaces is seen.  No palpable click is present,    Assessment:  Cyst 3rd interspace bilateral  Plan:  Injected the 3rd interspaces with kenalog and marcaine mix was carried out today.  She will notify me if the symptoms do no continue to improve.  Will consider mri for evaluation.

## 2014-03-18 ENCOUNTER — Telehealth: Payer: Self-pay | Admitting: Certified Nurse Midwife

## 2014-03-19 ENCOUNTER — Ambulatory Visit: Payer: BC Managed Care – PPO | Admitting: Certified Nurse Midwife

## 2014-04-14 ENCOUNTER — Encounter: Payer: Self-pay | Admitting: Certified Nurse Midwife

## 2014-05-14 ENCOUNTER — Ambulatory Visit: Payer: BC Managed Care – PPO | Admitting: Certified Nurse Midwife

## 2014-05-26 ENCOUNTER — Ambulatory Visit: Payer: BC Managed Care – PPO | Admitting: Certified Nurse Midwife

## 2014-07-09 ENCOUNTER — Telehealth: Payer: Self-pay | Admitting: Family Medicine

## 2014-07-09 NOTE — Telephone Encounter (Signed)
Pt would like to know if you will accept her as a pt? Pt's husband sees you and pt has been having headaches.

## 2014-08-20 ENCOUNTER — Encounter: Payer: Self-pay | Admitting: Certified Nurse Midwife

## 2014-08-20 ENCOUNTER — Ambulatory Visit (INDEPENDENT_AMBULATORY_CARE_PROVIDER_SITE_OTHER): Payer: BC Managed Care – PPO | Admitting: Certified Nurse Midwife

## 2014-08-20 VITALS — BP 120/66 | HR 72 | Resp 14 | Ht 65.0 in | Wt 176.0 lb

## 2014-08-20 DIAGNOSIS — Z0000 Encounter for general adult medical examination without abnormal findings: Secondary | ICD-10-CM

## 2014-08-20 DIAGNOSIS — Z01419 Encounter for gynecological examination (general) (routine) without abnormal findings: Secondary | ICD-10-CM

## 2014-08-20 LAB — POCT URINALYSIS DIPSTICK
LEUKOCYTES UA: NEGATIVE
PH UA: 5
UROBILINOGEN UA: NEGATIVE

## 2014-08-20 LAB — HEMOGLOBIN, FINGERSTICK: Hemoglobin, fingerstick: 15.1 g/dL (ref 12.0–16.0)

## 2014-08-20 NOTE — Patient Instructions (Signed)

## 2014-08-20 NOTE — Progress Notes (Signed)
53 y.o. G0P0 Married Caucasian Fe here for annual exam. Periods regular and changing in amount with decrease duration to 3-4 days.. No hot flashes or night sweats or vaginal dryness.  Working weight loss and exercise. Sees urgent care if needed.  Aware she needs mammogram and will schedule. No health issues today.  Patient's last menstrual period was 07/26/2014.          Sexually active: No.  The current method of family planning is none.    Exercising: Yes.    cardio  4-5x/wk Smoker:  no  Health Maintenance: Pap: 03/13/13 NEG HR HPV not detected MMG:  04/29/12 Bi-Rads 1 Colonoscopy:  10/23/12- Normal f/u 5 years  BMD:  Never had one TDaP:  02/21/12 Labs: Hgb: 15.1  ; Urine: Negative   reports that she quit smoking about 16 years ago. She has never used smokeless tobacco. She reports that she drinks about .5 ounces of alcohol per week. She reports that she does not use illicit drugs.  Past Medical History  Diagnosis Date  . Migraine headache   . Endometriosis   . Infertility, female     IVF 12 years ago  . Allergy     Past Surgical History  Procedure Laterality Date  . Hydrocele excision  1988    hernia, right lower abd  . Laparoscopy      removed left ovary    Current Outpatient Prescriptions  Medication Sig Dispense Refill  . KETOPROFEN PO Take 1 tablet by mouth as needed.      . loratadine-pseudoephedrine (EQ ALLERGY RELIEF D) 10-240 MG per 24 hr tablet Take 1 tablet by mouth daily.      Marland Kitchen topiramate (TOPAMAX) 25 MG tablet Take 75 mg by mouth daily.      Marland Kitchen zolmitriptan (ZOMIG) 5 MG nasal solution Place into the nose as needed for migraine.       No current facility-administered medications for this visit.    Family History  Problem Relation Age of Onset  . Breast cancer Mother 27  . Prostate cancer Father 55  . Uterine cancer Sister 25  . Kidney cancer Sister 25    ROS:  Pertinent items are noted in HPI.  Otherwise, a comprehensive ROS was negative.  Exam:   BP  120/66  Pulse 72  Resp 14  Ht 5\' 5"  (1.651 m)  Wt 176 lb (79.833 kg)  BMI 29.29 kg/m2  LMP 07/26/2014 Height: 5\' 5"  (165.1 cm)  Ht Readings from Last 3 Encounters:  08/20/14 5\' 5"  (1.651 m)  03/13/13 5' 4.5" (1.638 m)  10/23/12 5\' 4"  (1.626 m)    General appearance: alert, cooperative and appears stated age Head: Normocephalic, without obvious abnormality, atraumatic Neck: no adenopathy, supple, symmetrical, trachea midline and thyroid normal to inspection and palpation Lungs: clear to auscultation bilaterally Breasts: normal appearance, no masses or tenderness, No nipple retraction or dimpling, No nipple discharge or bleeding, No axillary or supraclavicular adenopathy Heart: regular rate and rhythm Abdomen: soft, non-tender; no masses,  no organomegaly Extremities: extremities normal, atraumatic, no cyanosis or edema Skin: Skin color, texture, turgor normal. No rashes or lesions Lymph nodes: Cervical, supraclavicular, and axillary nodes normal. No abnormal inguinal nodes palpated Neurologic: Grossly normal   Pelvic: External genitalia:  no lesions              Urethra:  normal appearing urethra with no masses, tenderness or lesions              Bartholin's and  Skene's: normal                 Vagina: normal appearing vagina with normal color and discharge, no lesions              Cervix: normal appearance, no lesions              Pap taken: No. Bimanual Exam:  Uterus:  normal size, contour, position, consistency, mobility, non-tender and anteverted              Adnexa: normal adnexa and no mass, fullness, tenderness               Rectovaginal: Confirms               Anus:  normal sphincter tone, no lesions  A:  Well Woman with normal exam  Contraception none desired  Screening labs  Family history of breast cancer mother age 37  P:   Reviewed health and wellness pertinent to exam  Aware of importance of mammogram, patient promises to schedule, declines our  scheduling  Lab: Lipid panel, Hgb A1-c, Vitamin D  Pap smear not taken today   counseled on breast self exam, adequate intake of calcium and vitamin D, diet and exercise   return annually or prn  An After Visit Summary was printed and given to the patient.

## 2014-08-21 LAB — LIPID PANEL
Cholesterol: 167 mg/dL (ref 0–200)
HDL: 52 mg/dL (ref >39)
LDL CALC: 95 mg/dL (ref 0–99)
Total CHOL/HDL Ratio: 3.2 Ratio
Triglycerides: 99 mg/dL (ref <150)
VLDL: 20 mg/dL (ref 0–40)

## 2014-08-21 LAB — HEMOGLOBIN A1C
HEMOGLOBIN A1C: 4.7 % (ref <5.7)
MEAN PLASMA GLUCOSE: 88 mg/dL (ref <117)

## 2014-08-21 NOTE — Progress Notes (Signed)
Reviewed personally.  M. Suzanne Clotilde Loth, MD.  

## 2014-08-22 LAB — VITAMIN D 25 HYDROXY (VIT D DEFICIENCY, FRACTURES): Vit D, 25-Hydroxy: 28 ng/mL — ABNORMAL LOW (ref 30–89)

## 2014-08-25 ENCOUNTER — Telehealth: Payer: Self-pay

## 2014-08-25 ENCOUNTER — Other Ambulatory Visit: Payer: Self-pay | Admitting: Certified Nurse Midwife

## 2014-08-25 DIAGNOSIS — E559 Vitamin D deficiency, unspecified: Secondary | ICD-10-CM

## 2014-08-25 NOTE — Telephone Encounter (Signed)
[email or web address]

## 2014-08-25 NOTE — Telephone Encounter (Signed)
Patient notified of results. See lab 

## 2014-08-25 NOTE — Telephone Encounter (Signed)
Message copied by Susy Manor on Tue Aug 25, 2014  1:45 PM ------      Message from: Regina Eck      Created: Tue Aug 25, 2014  8:51 AM       Notify patient that Lipid panel is normal      Hgb A1-c is normal      Vitamin D is low start on Vitamin D3 1000 IU daily recheck in 3 months  Order in ------

## 2014-11-16 ENCOUNTER — Other Ambulatory Visit (INDEPENDENT_AMBULATORY_CARE_PROVIDER_SITE_OTHER): Payer: BC Managed Care – PPO

## 2014-11-16 DIAGNOSIS — E559 Vitamin D deficiency, unspecified: Secondary | ICD-10-CM

## 2014-11-17 LAB — VITAMIN D 25 HYDROXY (VIT D DEFICIENCY, FRACTURES): Vit D, 25-Hydroxy: 30 ng/mL (ref 30–100)

## 2015-02-10 ENCOUNTER — Encounter: Payer: Self-pay | Admitting: Podiatrist

## 2015-02-10 ENCOUNTER — Ambulatory Visit (INDEPENDENT_AMBULATORY_CARE_PROVIDER_SITE_OTHER): Payer: BLUE CROSS/BLUE SHIELD | Admitting: Podiatrist

## 2015-02-10 VITALS — BP 131/79 | HR 64 | Resp 12

## 2015-02-10 DIAGNOSIS — M6740 Ganglion, unspecified site: Secondary | ICD-10-CM | POA: Diagnosis not present

## 2015-02-10 NOTE — Patient Instructions (Signed)

## 2015-02-17 NOTE — Progress Notes (Signed)
   Subjective:  Lisa Rice presents today for follow up of pain 3rd interspace left.  At the last visit we performed a cyst drainage and a steroid injection bilateral feet.  She relates improvement on the right but not complete resolvement on the left.  She relates a dull pain left 3rd interspace that is still bothersome.  She has responded very well to the drainage of cyst and steroid injection and would like to avoid surgery if possible.  Objective:  Neurovascular status unchanged and intact.  Dull discomfort is present 3rd interspace left.  NO Palpable fullness and inflammation in the interspace as usual is seen.  No palpable click is present,    Assessment:  Cyst 3rd interspace left  Plan:  Injected the 3rd interspace left with kenalog and marcaine mix  Under sterile technique.  She will notify me if the symptoms do no continue to improve or if the injection does not improve the symptoms.

## 2015-08-25 ENCOUNTER — Ambulatory Visit: Payer: BC Managed Care – PPO | Admitting: Certified Nurse Midwife

## 2015-10-19 ENCOUNTER — Ambulatory Visit (INDEPENDENT_AMBULATORY_CARE_PROVIDER_SITE_OTHER): Payer: BLUE CROSS/BLUE SHIELD | Admitting: Certified Nurse Midwife

## 2015-10-19 ENCOUNTER — Encounter: Payer: Self-pay | Admitting: Certified Nurse Midwife

## 2015-10-19 ENCOUNTER — Other Ambulatory Visit: Payer: Self-pay | Admitting: Certified Nurse Midwife

## 2015-10-19 VITALS — BP 102/68 | HR 68 | Resp 16 | Ht 64.75 in | Wt 170.0 lb

## 2015-10-19 DIAGNOSIS — Z124 Encounter for screening for malignant neoplasm of cervix: Secondary | ICD-10-CM | POA: Diagnosis not present

## 2015-10-19 DIAGNOSIS — Z0000 Encounter for general adult medical examination without abnormal findings: Secondary | ICD-10-CM

## 2015-10-19 DIAGNOSIS — R011 Cardiac murmur, unspecified: Secondary | ICD-10-CM

## 2015-10-19 DIAGNOSIS — Z01419 Encounter for gynecological examination (general) (routine) without abnormal findings: Secondary | ICD-10-CM

## 2015-10-19 DIAGNOSIS — Z1231 Encounter for screening mammogram for malignant neoplasm of breast: Secondary | ICD-10-CM

## 2015-10-19 LAB — COMPREHENSIVE METABOLIC PANEL
ALBUMIN: 4 g/dL (ref 3.6–5.1)
ALT: 11 U/L (ref 6–29)
AST: 13 U/L (ref 10–35)
Alkaline Phosphatase: 67 U/L (ref 33–130)
BILIRUBIN TOTAL: 0.4 mg/dL (ref 0.2–1.2)
BUN: 13 mg/dL (ref 7–25)
CO2: 25 mmol/L (ref 20–31)
Calcium: 8.6 mg/dL (ref 8.6–10.4)
Chloride: 108 mmol/L (ref 98–110)
Creat: 0.74 mg/dL (ref 0.50–1.05)
GLUCOSE: 74 mg/dL (ref 65–99)
Potassium: 4.2 mmol/L (ref 3.5–5.3)
Sodium: 140 mmol/L (ref 135–146)
Total Protein: 6.3 g/dL (ref 6.1–8.1)

## 2015-10-19 LAB — LIPID PANEL
CHOL/HDL RATIO: 3.1 ratio (ref <=5.0)
CHOLESTEROL: 157 mg/dL (ref 125–200)
HDL: 51 mg/dL (ref >=46)
LDL Cholesterol: 89 mg/dL (ref <130)
TRIGLYCERIDES: 84 mg/dL (ref <150)
VLDL: 17 mg/dL (ref <30)

## 2015-10-19 LAB — CBC
HCT: 41.7 % (ref 36.0–46.0)
Hemoglobin: 14.4 g/dL (ref 12.0–15.0)
MCH: 32.4 pg (ref 26.0–34.0)
MCHC: 34.5 g/dL (ref 30.0–36.0)
MCV: 93.9 fL (ref 78.0–100.0)
MPV: 8.8 fL (ref 8.6–12.4)
PLATELETS: 207 10*3/uL (ref 150–400)
RBC: 4.44 MIL/uL (ref 3.87–5.11)
RDW: 12.8 % (ref 11.5–15.5)
WBC: 5.1 10*3/uL (ref 4.0–10.5)

## 2015-10-19 LAB — POCT URINALYSIS DIPSTICK
Bilirubin, UA: NEGATIVE
Blood, UA: NEGATIVE
Glucose, UA: NEGATIVE
KETONES UA: NEGATIVE
LEUKOCYTES UA: NEGATIVE
Nitrite, UA: NEGATIVE
PH UA: 5
PROTEIN UA: NEGATIVE
UROBILINOGEN UA: NEGATIVE

## 2015-10-19 LAB — TSH: TSH: 1.287 u[IU]/mL (ref 0.350–4.500)

## 2015-10-19 NOTE — Progress Notes (Signed)
54 y.o. G0P0, 1 adopted son, Married  Caucasian Fe here for annual exam.  Periods changing with some 28 days to 32 days and light/heavy changes. Contraception condoms working well. No health issues in past year. Sees Urgent care if needed. Desires screening labs today. Still has not had her mammogram, "just forgets". Busy with teaching!  Patient's last menstrual period was 10/11/2015.          Sexually active: Yes.    The current method of family planning is condoms sometimes.    Exercising: Yes.    aerobics Smoker:  no  Health Maintenance: Pap:  03-13-13 neg HPV HR neg MMG: 04-26-12 birads 1:neg. Colonoscopy:  2013 f/u 75yrs BMD:   none TDaP:  2013 Labs: poct urine-neg Self breast exam: done occ   reports that she has never smoked. She has never used smokeless tobacco. She reports that she drinks alcohol. She reports that she does not use illicit drugs.  Past Medical History  Diagnosis Date  . Migraine headache   . Endometriosis   . Infertility, female     IVF 12 years ago  . Allergy     Past Surgical History  Procedure Laterality Date  . Hydrocele excision  1988    hernia, right lower abd  . Laparoscopy      removed left ovary    Current Outpatient Prescriptions  Medication Sig Dispense Refill  . ibuprofen (ADVIL,MOTRIN) 200 MG tablet Take 200 mg by mouth every 6 (six) hours as needed.    . topiramate (TOPAMAX) 25 MG tablet Take 75 mg by mouth daily.    Marland Kitchen zolmitriptan (ZOMIG) 5 MG nasal solution Place into the nose as needed for migraine.     No current facility-administered medications for this visit.    Family History  Problem Relation Age of Onset  . Breast cancer Mother 49  . Prostate cancer Father 54  . Uterine cancer Sister 38  . Kidney cancer Sister 23    ROS:  Pertinent items are noted in HPI.  Otherwise, a comprehensive ROS was negative.  Exam:   BP 102/68 mmHg  Pulse 68  Resp 16  Ht 5' 4.75" (1.645 m)  Wt 170 lb (77.111 kg)  BMI 28.50 kg/m2  LMP  10/11/2015 Height: 5' 4.75" (164.5 cm) Ht Readings from Last 3 Encounters:  10/19/15 5' 4.75" (1.645 m)  08/20/14 5\' 5"  (1.651 m)  03/13/13 5' 4.5" (1.638 m)    General appearance: alert, cooperative and appears stated age Head: Normocephalic, without obvious abnormality, atraumatic Neck: no adenopathy, supple, symmetrical, trachea midline and thyroid normal to inspection and palpation Lungs: clear to auscultation bilaterally Breasts: normal appearance, no masses or tenderness, No nipple retraction or dimpling, No nipple discharge or bleeding, No axillary or supraclavicular adenopathy Heart: regular rate with murmur heard  Abdomen: soft, non-tender; no masses,  no organomegaly Extremities: extremities normal, atraumatic, no cyanosis or edema Skin: Skin color, texture, turgor normal. No rashes or lesions Lymph nodes: Cervical, supraclavicular, and axillary nodes normal. No abnormal inguinal nodes palpated Neurologic: Grossly normal   Pelvic: External genitalia:  no lesions              Urethra:  normal appearing urethra with no masses, tenderness or lesions              Bartholin's and Skene's: normal                 Vagina: normal appearing vagina with normal color and discharge, no lesions  Cervix: normal, no lesions or tenderness              Pap taken: Yes.   Bimanual Exam:  Uterus:  normal size, contour, position, consistency, mobility, non-tender              Adnexa: normal adnexa and no mass, fullness, tenderness               Rectovaginal: Confirms               Anus:  normal sphincter tone, no lesions  Chaperone present: yes  A:  Well Woman with normal exam  Contraception condoms  Heart murmur noted  Perimenopausal with slight cycle changes  Screening labs  Mammogram over due, Family history of breast cancer, mother  P:   Reviewed health and wellness pertinent to exam  Discussed heart findings and need for evaluation with cardiology. Patient agreeable.  Warning signs of heart issues given and need to be seen in ER. Referral order in and patient will be called with appointment details.Questions addressed regarding findings.  Discussed perimenopause and etiology. Discussed bleeding and cycle change expectations. Menses record given with abnormal/normal parameters and need to advise if occurs. Questions addressed.  Labs: CMP,Lipid panel,TSH, Vitamin D  Mammogram scheduled prior to leaving today and stressed importance of SBE and mammogram yearly.  Pap smear as above with HPV reflex   counseled on breast self exam, mammography screening, menopause, adequate intake of calcium and vitamin D, diet and exercise  return annually or prn  An After Visit Summary was printed and given to the patient.

## 2015-10-19 NOTE — Progress Notes (Signed)
[facility] at 10:40 am.

## 2015-10-19 NOTE — Patient Instructions (Signed)
EXERCISE AND DIET:  We recommended that you start or continue a regular exercise program for good health. Regular exercise means any activity that makes your heart beat faster and makes you sweat.  We recommend exercising at least 30 minutes per day at least 3 days a week, preferably 4 or 5.  We also recommend a diet low in fat and sugar.  Inactivity, poor dietary choices and obesity can cause diabetes, heart attack, stroke, and kidney damage, among others.    ALCOHOL AND SMOKING:  Women should limit their alcohol intake to no more than 7 drinks/beers/glasses of wine (combined, not each!) per week. Moderation of alcohol intake to this level decreases your risk of breast cancer and liver damage. And of course, no recreational drugs are part of a healthy lifestyle.  And absolutely no smoking or even second hand smoke. Most people know smoking can cause heart and lung diseases, but did you know it also contributes to weakening of your bones? Aging of your skin?  Yellowing of your teeth and nails?  CALCIUM AND VITAMIN D:  Adequate intake of calcium and Vitamin D are recommended.  The recommendations for exact amounts of these supplements seem to change often, but generally speaking 600 mg of calcium (either carbonate or citrate) and 800 units of Vitamin D per day seems prudent. Certain women may benefit from higher intake of Vitamin D.  If you are among these women, your doctor will have told you during your visit.    PAP SMEARS:  Pap smears, to check for cervical cancer or precancers,  have traditionally been done yearly, although recent scientific advances have shown that most women can have pap smears less often.  However, every woman still should have a physical exam from her gynecologist every year. It will include a breast check, inspection of the vulva and vagina to check for abnormal growths or skin changes, a visual exam of the cervix, and then an exam to evaluate the size and shape of the uterus and  ovaries.  And after 52 years of age, a rectal exam is indicated to check for rectal cancers. We will also provide age appropriate advice regarding health maintenance, like when you should have certain vaccines, screening for sexually transmitted diseases, bone density testing, colonoscopy, mammograms, etc.   MAMMOGRAMS:  All women over 40 years old should have a yearly mammogram. Many facilities now offer a "3D" mammogram, which may cost around $50 extra out of pocket. If possible,  we recommend you accept the option to have the 3D mammogram performed.  It both reduces the number of women who will be called back for extra views which then turn out to be normal, and it is better than the routine mammogram at detecting truly abnormal areas.    COLONOSCOPY:  Colonoscopy to screen for colon cancer is recommended for all women at age 50.  We know, you hate the idea of the prep.  We agree, BUT, having colon cancer and not knowing it is worse!!  Colon cancer so often starts as a polyp that can be seen and removed at colonscopy, which can quite literally save your life!  And if your first colonoscopy is normal and you have no family history of colon cancer, most women don't have to have it again for 10 years.  Once every ten years, you can do something that may end up saving your life, right?  We will be happy to help you get it scheduled when you are ready.    Be sure to check your insurance coverage so you understand how much it will cost.  It may be covered as a preventative service at no cost, but you should check your particular policy.     Perimenopause Perimenopause is the time when your body begins to move into the menopause (no menstrual period for 12 straight months). It is a natural process. Perimenopause can begin 2-8 years before the menopause and usually lasts for 1 year after the menopause. During this time, your ovaries may or may not produce an egg. The ovaries vary in their production of estrogen and  progesterone hormones each month. This can cause irregular menstrual periods, difficulty getting pregnant, vaginal bleeding between periods, and uncomfortable symptoms. CAUSES  Irregular production of the ovarian hormones, estrogen and progesterone, and not ovulating every month.  Other causes include:  Tumor of the pituitary gland in the brain.  Medical disease that affects the ovaries.  Radiation treatment.  Chemotherapy.  Unknown causes.  Heavy smoking and excessive alcohol intake can bring on perimenopause sooner. SIGNS AND SYMPTOMS   Hot flashes.  Night sweats.  Irregular menstrual periods.  Decreased sex drive.  Vaginal dryness.  Headaches.  Mood swings.  Depression.  Memory problems.  Irritability.  Tiredness.  Weight gain.  Trouble getting pregnant.  The beginning of losing bone cells (osteoporosis).  The beginning of hardening of the arteries (atherosclerosis). DIAGNOSIS  Your health care provider will make a diagnosis by analyzing your age, menstrual history, and symptoms. He or she will do a physical exam and note any changes in your body, especially your female organs. Female hormone tests may or may not be helpful depending on the amount of female hormones you produce and when you produce them. However, other hormone tests may be helpful to rule out other problems. TREATMENT  In some cases, no treatment is needed. The decision on whether treatment is necessary during the perimenopause should be made by you and your health care provider based on how the symptoms are affecting you and your lifestyle. Various treatments are available, such as:  Treating individual symptoms with a specific medicine for that symptom.  Herbal medicines that can help specific symptoms.  Counseling.  Group therapy. HOME CARE INSTRUCTIONS   Keep track of your menstrual periods (when they occur, how heavy they are, how long between periods, and how long they last) as  well as your symptoms and when they started.  Only take over-the-counter or prescription medicines as directed by your health care provider.  Sleep and rest.  Exercise.  Eat a diet that contains calcium (good for your bones) and soy (acts like the estrogen hormone).  Do not smoke.  Avoid alcoholic beverages.  Take vitamin supplements as recommended by your health care provider. Taking vitamin E may help in certain cases.  Take calcium and vitamin D supplements to help prevent bone loss.  Group therapy is sometimes helpful.  Acupuncture may help in some cases. SEEK MEDICAL CARE IF:   You have questions about any symptoms you are having.  You need a referral to a specialist (gynecologist, psychiatrist, or psychologist). SEEK IMMEDIATE MEDICAL CARE IF:   You have vaginal bleeding.  Your period lasts longer than 8 days.  Your periods are recurring sooner than 21 days.  You have bleeding after intercourse.  You have severe depression.  You have pain when you urinate.  You have severe headaches.  You have vision problems.   This information is not intended to replace advice   given to you by your health care provider. Make sure you discuss any questions you have with your health care provider.   Document Released: 12/21/2004 Document Revised: 12/04/2014 Document Reviewed: 06/12/2013 Elsevier Interactive Patient Education 2016 [employer]  

## 2015-10-20 LAB — VITAMIN D 25 HYDROXY (VIT D DEFICIENCY, FRACTURES): Vit D, 25-Hydroxy: 20 ng/mL — ABNORMAL LOW (ref 30–100)

## 2015-10-20 NOTE — Progress Notes (Signed)
Reviewed personally.  M. Suzanne Lakaisha Danish, MD.  

## 2015-10-22 LAB — IPS PAP TEST WITH REFLEX TO HPV

## 2015-10-26 ENCOUNTER — Telehealth: Payer: Self-pay | Admitting: Certified Nurse Midwife

## 2015-10-26 NOTE — Telephone Encounter (Signed)
Left voicemail regarding referral appointment. The information is listed below. Should the patient need to cancel or reschedule this appointment, please advise them to call the office they've been referred to in order to reschedule.  [facility] [address], [place], [place] [address]  Phone: [phone]   Dr Kirk Ruths   10/29/15 arrive 11:30 for 12pm appointment

## 2015-10-28 ENCOUNTER — Telehealth: Payer: Self-pay

## 2015-10-28 MED ORDER — FLUCONAZOLE 150 MG PO TABS: 150.0000 mg | ORAL_TABLET | Freq: Once | ORAL | Status: DC

## 2015-10-28 NOTE — Telephone Encounter (Signed)
-----   Message from Kem [place]s, [place] sent at 10/27/2015  6:00 PM EST ----- Pap 02.  Let patient know that yeast was on her pap.  Give her Diflucan 150 mg X 2.

## 2015-10-28 NOTE — Telephone Encounter (Signed)
[email or web address]

## 2015-10-28 NOTE — Addendum Note (Signed)
Addended by: Abelino Derrick C on: 10/28/2015 10:09 AM   Modules accepted: Orders, SmartSet

## 2015-10-29 ENCOUNTER — Encounter: Payer: Self-pay | Admitting: Cardiology

## 2015-10-29 DIAGNOSIS — R0989 Other specified symptoms and signs involving the circulatory and respiratory systems: Secondary | ICD-10-CM

## 2015-10-29 NOTE — Progress Notes (Signed)
This encounter was created in error - please disregard.

## 2015-10-29 NOTE — Telephone Encounter (Signed)
Left message for call back.

## 2015-11-04 NOTE — Telephone Encounter (Signed)
yes

## 2015-11-04 NOTE — Telephone Encounter (Signed)
Patient called twice with no callback. Okay to close encounter since no symptoms or does patient need a letter sent. Please advise.

## 2015-11-04 NOTE — Telephone Encounter (Signed)
Close encounter 

## 2015-11-04 NOTE — Telephone Encounter (Signed)
Which would you like done?

## 2015-11-25 ENCOUNTER — Ambulatory Visit
Admission: RE | Admit: 2015-11-25 | Discharge: 2015-11-25 | Disposition: A | Discharge status: Home / Self Care | Payer: BLUE CROSS/BLUE SHIELD | Source: Ambulatory Visit | Attending: Certified Nurse Midwife | Admitting: Certified Nurse Midwife

## 2015-11-25 DIAGNOSIS — Z1231 Encounter for screening mammogram for malignant neoplasm of breast: Secondary | ICD-10-CM

## 2016-03-30 ENCOUNTER — Encounter: Payer: Self-pay | Admitting: Certified Nurse Midwife

## 2016-10-23 ENCOUNTER — Other Ambulatory Visit: Payer: Self-pay | Admitting: Certified Nurse Midwife

## 2016-10-23 DIAGNOSIS — Z1231 Encounter for screening mammogram for malignant neoplasm of breast: Secondary | ICD-10-CM

## 2016-10-24 ENCOUNTER — Ambulatory Visit (INDEPENDENT_AMBULATORY_CARE_PROVIDER_SITE_OTHER): Payer: BLUE CROSS/BLUE SHIELD | Admitting: Certified Nurse Midwife

## 2016-10-24 ENCOUNTER — Encounter: Payer: Self-pay | Admitting: Certified Nurse Midwife

## 2016-10-24 VITALS — BP 122/80 | HR 60 | Resp 14 | Ht 64.5 in | Wt 161.0 lb

## 2016-10-24 DIAGNOSIS — Z0000 Encounter for general adult medical examination without abnormal findings: Secondary | ICD-10-CM | POA: Diagnosis not present

## 2016-10-24 DIAGNOSIS — Z01419 Encounter for gynecological examination (general) (routine) without abnormal findings: Secondary | ICD-10-CM | POA: Diagnosis not present

## 2016-10-24 LAB — POCT URINALYSIS DIPSTICK
Bilirubin, UA: NEGATIVE
Glucose, UA: NEGATIVE
Ketones, UA: NEGATIVE
LEUKOCYTES UA: NEGATIVE
NITRITE UA: NEGATIVE
PH UA: 6.5
PROTEIN UA: NEGATIVE
RBC UA: NEGATIVE
UROBILINOGEN UA: NEGATIVE

## 2016-10-24 LAB — TSH: TSH: 1.34 m[IU]/L

## 2016-10-24 LAB — HEMOGLOBIN, FINGERSTICK: HEMOGLOBIN, FINGERSTICK: 14.4 g/dL (ref 12.0–16.0)

## 2016-10-24 NOTE — Progress Notes (Signed)
55 y.o. G0P0000 Married  Caucasian Fe here for annual exam.  Periods continue to be every 20 to 21 days and very light, with 3 day duration. Denies heavy bleeding. No hot flashes or night sweats. Sees Urgent care if needed.  Has been eating better and has lost 9 pounds. Feels so much better. Screening labs desired. Sees Dr. Domingo Cocking for headache management, all normal  Patient LMP 10-21-16           Sexually active: No. her choice The current method of family planning is none.    Exercising: Yes.    walking Smoker:  no  Health Maintenance: Pap:  10-19-15 neg MMG:  11-25-15 category c density birads 1:neg schedule for 1/18 Colonoscopy:  2013 f/u 16yrs BMD:   none TDaP:  2013 Shingles: no Pneumonia: no Hep C and HIV: would like to discuss Labs: Drawn today   Self breast exam:ocassionally    reports that she has never smoked. She has never used smokeless tobacco. She reports that she drinks alcohol. She reports that she does not use drugs.  Past Medical History:  Diagnosis Date  . Allergy   . Endometriosis   . Infertility, female    IVF 12 years ago  . Migraine headache     Past Surgical History:  Procedure Laterality Date  . HYDROCELE EXCISION  1988   hernia, right lower abd  . LAPAROSCOPY     removed left ovary    Current Outpatient Prescriptions  Medication Sig Dispense Refill  . fluconazole (DIFLUCAN) 150 MG tablet Take 1 tablet (150 mg total) by mouth once. May repeat in 48 hours 2 tablet 0  . ibuprofen (ADVIL,MOTRIN) 200 MG tablet Take 200 mg by mouth every 6 (six) hours as needed.    . topiramate (TOPAMAX) 25 MG tablet Take 75 mg by mouth daily.    Marland Kitchen zolmitriptan (ZOMIG) 5 MG nasal solution Place into the nose as needed for migraine.     No current facility-administered medications for this visit.     Family History  Problem Relation Age of Onset  . Breast cancer Mother 73  . Prostate cancer Father 69  . Uterine cancer Sister 1  . Kidney cancer Sister 70     ROS:  Pertinent items are noted in HPI.  Otherwise, a comprehensive ROS was negative.  Exam:   BP 122/80 (BP Location: Right Arm, Patient Position: Sitting, Cuff Size: Normal)   Pulse 60   Resp 14   Ht 5' 4.5" (1.638 m)   Wt 161 lb (73 kg)   LMP 10/21/2016   BMI 27.21 kg/m  Height: 5' 4.5" (163.8 cm) Ht Readings from Last 3 Encounters:  10/24/16 5' 4.5" (1.638 m)  10/19/15 5' 4.75" (1.645 m)  08/20/14 5\' 5"  (1.651 m)    General appearance: alert, cooperative and appears stated age Head: Normocephalic, without obvious abnormality, atraumatic Neck: no adenopathy, supple, symmetrical, trachea midline and thyroid normal to inspection and palpation Lungs: clear to auscultation bilaterally Breasts: normal appearance, no masses or tenderness, No nipple retraction or dimpling, No nipple discharge or bleeding, No axillary or supraclavicular adenopathy Heart: regular rate and rhythm Abdomen: soft, non-tender; no masses,  no organomegaly Extremities: extremities normal, atraumatic, no cyanosis or edema Skin: Skin color, texture, turgor normal. No rashes or lesions Lymph nodes: Cervical, supraclavicular, and axillary nodes normal. No abnormal inguinal nodes palpated Neurologic: Grossly normal   Pelvic: External genitalia:  no lesions  Urethra:  normal appearing urethra with no masses, tenderness or lesions              Bartholin's and Skene's: normal                 Vagina: normal appearing vagina with normal color and discharge, no lesions              Cervix: no cervical motion tenderness, no lesions and normal appearance              Pap taken: no cervical motion tenderness, no lesions and normal appearance Bimanual Exam:  Uterus:  normal size, contour, position, consistency, mobility, non-tender              Adnexa: normal adnexa and no mass, fullness, tenderness               Rectovaginal: Confirms               Anus:  normal sphincter tone, no lesions  Chaperone  present: yes  A:  Well Woman with normal exam  Not sexually active personal choice  Migraine headache management with Neurology, stable  Screening labs   P:   Reviewed health and wellness pertinent to exam  Continue follow up as indicated  Labs: TSH, Vitamin D, Hep. C  Pap smear as above not taken   counseled on breast self exam, mammography screening, menopause, adequate intake of calcium and vitamin D, diet and exercise  return annually or prn  An After Visit Summary was printed and given to the patient.

## 2016-10-24 NOTE — Patient Instructions (Signed)

## 2016-10-25 ENCOUNTER — Telehealth: Payer: Self-pay

## 2016-10-25 ENCOUNTER — Other Ambulatory Visit: Payer: Self-pay | Admitting: Certified Nurse Midwife

## 2016-10-25 DIAGNOSIS — E559 Vitamin D deficiency, unspecified: Secondary | ICD-10-CM

## 2016-10-25 LAB — HEPATITIS C ANTIBODY: HCV AB: NEGATIVE

## 2016-10-25 LAB — VITAMIN D 25 HYDROXY (VIT D DEFICIENCY, FRACTURES): VIT D 25 HYDROXY: 29 ng/mL — AB (ref 30–100)

## 2016-10-25 NOTE — Telephone Encounter (Signed)
Patient notified of results. See lab 

## 2016-10-25 NOTE — Telephone Encounter (Signed)
[email or web address]

## 2016-10-25 NOTE — Telephone Encounter (Signed)
-----   Message from Regina Eck, CNM sent at 10/25/2016  7:54 AM EST ----- Notfy patient Vitamin D is still low needs to make sure she is taking 1000 IU Vitamin D3 daily and increase amount in diet daily, needs recheck in 3 months order in Hep C negative and TSH is normal

## 2016-10-27 NOTE — Progress Notes (Signed)
Encounter reviewed Jill Jertson, MD   

## 2016-12-08 ENCOUNTER — Ambulatory Visit: Payer: BLUE CROSS/BLUE SHIELD | Admitting: Podiatry

## 2016-12-11 ENCOUNTER — Ambulatory Visit: Payer: BLUE CROSS/BLUE SHIELD

## 2016-12-18 ENCOUNTER — Ambulatory Visit (INDEPENDENT_AMBULATORY_CARE_PROVIDER_SITE_OTHER): Payer: BLUE CROSS/BLUE SHIELD

## 2016-12-18 ENCOUNTER — Ambulatory Visit
Admission: RE | Admit: 2016-12-18 | Discharge: 2016-12-18 | Disposition: A | Discharge status: Home / Self Care | Payer: BLUE CROSS/BLUE SHIELD | Source: Ambulatory Visit | Attending: Certified Nurse Midwife | Admitting: Certified Nurse Midwife

## 2016-12-18 ENCOUNTER — Ambulatory Visit (INDEPENDENT_AMBULATORY_CARE_PROVIDER_SITE_OTHER): Payer: BLUE CROSS/BLUE SHIELD | Admitting: Podiatry

## 2016-12-18 ENCOUNTER — Encounter: Payer: Self-pay | Admitting: Podiatry

## 2016-12-18 VITALS — BP 140/87 | HR 56 | Resp 16 | Ht 64.0 in | Wt 160.0 lb

## 2016-12-18 DIAGNOSIS — M79675 Pain in left toe(s): Secondary | ICD-10-CM

## 2016-12-18 DIAGNOSIS — M6740 Ganglion, unspecified site: Secondary | ICD-10-CM

## 2016-12-18 DIAGNOSIS — Z1231 Encounter for screening mammogram for malignant neoplasm of breast: Secondary | ICD-10-CM

## 2016-12-18 DIAGNOSIS — D3610 Benign neoplasm of peripheral nerves and autonomic nervous system, unspecified: Secondary | ICD-10-CM | POA: Diagnosis not present

## 2016-12-18 NOTE — Patient Instructions (Addendum)

## 2016-12-18 NOTE — Progress Notes (Signed)
Subjective:     Patient ID: Lisa Rice, female   DOB: [birth year], 56 y.o.   MRN: [id number]  HPI patient presents stating that she's had this cyst between the third and fourth toes left that's been chronically sore and she thinks it's been drained in the past but it simply only gives her so much relief and it causes her to develop increased discomfort with shoe gear and ambulation   Review of Systems     Objective:   Physical Exam Neurovascular status intact muscle strength was adequate with patient found to have discomfort third intermetatarsal space left with radiating-type discomfort and a positive Mulder sign with discomfort when palpated and the foot is squeezed. Patient's noted to have good digital perfusion well oriented 3    Assessment:     Strong possibility for neuroma or other unknown soft tissue tumor third interspace left foot that has been giving her trouble for a number of years    Plan:     H&P discussed condition at great length. I do think that this needs to be excised and I discussed this with patient and reviewed with patient at great length what would be necessary. Patient wants to have this done understanding that this may or may not solve the problem and that she may develop numbness in between her toes. Patient is willing to accept risk of the procedure and after discussion signs consent form and is given all preoperative instructions and is encouraged to call with any questions. She wants to discuss this with her husband and then we will contact us to schedule surgery and also she does understand that recovery can be approximate 6 months. I did offer her injection treatment if she decides not to have surgical intervention of this particular problem

## 2016-12-19 ENCOUNTER — Telehealth: Payer: Self-pay | Admitting: *Deleted

## 2016-12-19 NOTE — Telephone Encounter (Signed)
"  I'm calling to get some dates for my surgery with Dr. Paulla Dolly."  He can do it on January 30 or February 13 or 20.  "I can't do it on the 20th.  I will have to check with my husband and give you a call back."

## 2016-12-25 NOTE — Telephone Encounter (Signed)
"  I'm trying to schedule surgery with Dr. Paulla Dolly.  I'm hoping to do it February 20.  Please give me a call."

## 2016-12-26 NOTE — Telephone Encounter (Signed)
"  I was hoping to get on Dr. Mellody Drown surgery schedule for February 20.  I look forward to speaking with you, thank you."  12/27/2016 "I'm returning your call.  I have you scheduled for February 20.  "Okay great, someone will call me the Friday or Monday before with the arrival time.  What's the latest he does it?"  He could go as late as a little after lunch time, 12 N.

## 2017-01-16 ENCOUNTER — Encounter: Payer: Self-pay | Admitting: Podiatry

## 2017-01-16 DIAGNOSIS — G5762 Lesion of plantar nerve, left lower limb: Secondary | ICD-10-CM | POA: Diagnosis not present

## 2017-01-19 ENCOUNTER — Telehealth: Payer: Self-pay | Admitting: *Deleted

## 2017-01-19 ENCOUNTER — Encounter: Payer: Self-pay | Admitting: Podiatry

## 2017-01-19 NOTE — Telephone Encounter (Signed)
POV call: Called pt, L/M to call back if she was having any problems, questions or concerns.

## 2017-01-24 ENCOUNTER — Other Ambulatory Visit (INDEPENDENT_AMBULATORY_CARE_PROVIDER_SITE_OTHER): Payer: BLUE CROSS/BLUE SHIELD

## 2017-01-24 ENCOUNTER — Ambulatory Visit (INDEPENDENT_AMBULATORY_CARE_PROVIDER_SITE_OTHER): Payer: BLUE CROSS/BLUE SHIELD | Admitting: Podiatry

## 2017-01-24 ENCOUNTER — Other Ambulatory Visit: Payer: BLUE CROSS/BLUE SHIELD

## 2017-01-24 VITALS — Temp 97.0°F

## 2017-01-24 DIAGNOSIS — D3610 Benign neoplasm of peripheral nerves and autonomic nervous system, unspecified: Secondary | ICD-10-CM

## 2017-01-24 DIAGNOSIS — E559 Vitamin D deficiency, unspecified: Secondary | ICD-10-CM

## 2017-01-24 NOTE — Progress Notes (Signed)
Subjective:     Patient ID: Lisa Rice, female   DOB: Jun 07, 1961, 56 y.o.   MRN: [id number]  HPI patient presents stating she's doing very well   Review of Systems     Objective:   Physical Exam Neurovascular status intact negative Homans sign was noted with patient's left third interspace healing well with wound edges well coapted and no drainage    Assessment:     Doing well post neuroma left    Plan:     Advised on physical therapy compression elevation and applied sterile dressing and reappoint to recheck

## 2017-01-25 LAB — VITAMIN D 25 HYDROXY (VIT D DEFICIENCY, FRACTURES): VIT D 25 HYDROXY: 31 ng/mL (ref 30–100)

## 2017-04-19 NOTE — Progress Notes (Signed)
DOS 01/16/2017 Excision Soft Tissue Mass 3rd innerspace(left)

## 2017-08-23 ENCOUNTER — Ambulatory Visit (INDEPENDENT_AMBULATORY_CARE_PROVIDER_SITE_OTHER): Payer: BLUE CROSS/BLUE SHIELD

## 2017-08-23 ENCOUNTER — Ambulatory Visit (INDEPENDENT_AMBULATORY_CARE_PROVIDER_SITE_OTHER): Payer: BLUE CROSS/BLUE SHIELD | Admitting: Podiatry

## 2017-08-23 ENCOUNTER — Encounter: Payer: Self-pay | Admitting: Podiatry

## 2017-08-23 DIAGNOSIS — M6740 Ganglion, unspecified site: Secondary | ICD-10-CM

## 2017-08-23 DIAGNOSIS — D3610 Benign neoplasm of peripheral nerves and autonomic nervous system, unspecified: Secondary | ICD-10-CM

## 2017-08-23 DIAGNOSIS — M79671 Pain in right foot: Secondary | ICD-10-CM | POA: Diagnosis not present

## 2017-08-23 NOTE — Progress Notes (Signed)
Subjective:    Patient ID: Lisa Rice, female   DOB: 56 y.o.   MRN: [id number]   HPI patient states her right foot is starting to feel like her left and she is due to do a 13 mile walk in [place] in October    ROS      Objective:  Physical Exam neurovascular status intact with well-healed surgical site left third interspace with radiating-like discomfort third interspace right with the possibility for cyst formation     Assessment:  Possibility for ganglionic cyst versus possibility for neuroma symptomatology third right      Plan:    H&P and discussed both conditions. At this time I did a proximal nerve block of the area aspirated it got out a small amount of fluid and injected with a combination of dexamethasone Kenalog Xylocaine and we'll see results in 2 weeks and ultimately may require excision as we did on the left foot  X-rays indicate no indication stress fracture advanced arthritis

## 2017-09-06 ENCOUNTER — Ambulatory Visit (INDEPENDENT_AMBULATORY_CARE_PROVIDER_SITE_OTHER): Payer: BLUE CROSS/BLUE SHIELD | Admitting: Podiatry

## 2017-09-06 ENCOUNTER — Encounter: Payer: Self-pay | Admitting: Podiatry

## 2017-09-06 VITALS — BP 118/72 | HR 52 | Resp 16

## 2017-09-06 DIAGNOSIS — D3610 Benign neoplasm of peripheral nerves and autonomic nervous system, unspecified: Secondary | ICD-10-CM

## 2017-09-06 NOTE — Patient Instructions (Signed)
Pre-Operative Instructions  Congratulations, you have decided to take an important step towards improving your quality of life.  You can be assured that the doctors and staff at [facility] will be with you every step of the way.  Here are some important things you should know:  1. Plan to be at the surgery center/hospital at least 1 (one) hour prior to your scheduled time, unless otherwise directed by the surgical center/hospital staff.  You must have a responsible adult accompany you, remain during the surgery and drive you home.  Make sure you have directions to the surgical center/hospital to ensure you arrive on time. 2. If you are having surgery at [facility] or [facility], you will need a copy of your medical history and physical form from your family physician within one month prior to the date of surgery. We will give you a form for your primary physician to complete.  3. We make every effort to accommodate the date you request for surgery.  However, there are times where surgery dates or times have to be moved.  We will contact you as soon as possible if a change in schedule is required.   4. No aspirin/ibuprofen for one week before surgery.  If you are on aspirin, any non-steroidal anti-inflammatory medications (Mobic, Aleve, Ibuprofen) should not be taken seven (7) days prior to your surgery.  You make take Tylenol for pain prior to surgery.  5. Medications - If you are taking daily heart and blood pressure medications, seizure, reflux, allergy, asthma, anxiety, pain or diabetes medications, make sure you notify the surgery center/hospital before the day of surgery so they can tell you which medications you should take or avoid the day of surgery. 6. No food or drink after midnight the night before surgery unless directed otherwise by surgical center/hospital staff. 7. No alcoholic beverages 24-hours prior to surgery.  No smoking 24-hours prior or 24-hours after  surgery. 8. Wear loose pants or shorts. They should be loose enough to fit over bandages, boots, and casts. 9. Don't wear slip-on shoes. Sneakers are preferred. 10. Bring your boot with you to the surgery center/hospital.  Also bring crutches or a walker if your physician has prescribed it for you.  If you do not have this equipment, it will be provided for you after surgery. 11. If you have not been contacted by the surgery center/hospital by the day before your surgery, call to confirm the date and time of your surgery. 12. Leave-time from work may vary depending on the type of surgery you have.  Appropriate arrangements should be made prior to surgery with your employer. 13. Prescriptions will be provided immediately following surgery by your doctor.  Fill these as soon as possible after surgery and take the medication as directed. Pain medications will not be refilled on weekends and must be approved by the doctor. 14. Remove nail polish on the operative foot and avoid getting pedicures prior to surgery. 15. Wash the night before surgery.  The night before surgery wash the foot and leg well with water and the antibacterial soap provided. Be sure to pay special attention to beneath the toenails and in between the toes.  Wash for at least three (3) minutes. Rinse thoroughly with water and dry well with a towel.  Perform this wash unless told not to do so by your physician.  Enclosed: 1 Ice pack (please put in freezer the night before surgery)   1 Hibiclens skin cleaner     Pre-op instructions  If you have any questions regarding the instructions, please do not hesitate to call our office.  [place]: [address], [place], [place] [address] -- [phone]  [place]: [address] [place], [place] [address] -- [phone]  [place]: [address]  [place], [place] [address] -- [phone]  [place]: [address], [place], [place] [address] -- [phone]  Website:  [email or web address] 

## 2017-09-06 NOTE — Progress Notes (Signed)
Subjective:    Patient ID: Lisa Rice, female   DOB: 56 y.o.   MRN: [id number]   HPI patient states that she still having pain in her right forefoot and that the medication helped her for around a week and now it's come back again and she knows that she's getting need to get this fixed    ROS      Objective:  Physical Exam neurovascular status with patient noted to have palpable mass third interspace right and history of having 1 left that was excised   Assessment:    Probability for neuroma third interspace right or other space occupying mass with history of having excision left     Plan:    H&P condition reviewed and recommended excision. I explained procedure and reviewed risk associated with this and patient wants the surgery done understanding all risk. Patient is scheduled for outpatient surgery and after extensive review of consent form going over alternative treatments complications she signed consent form. Understands no guarantee as far success wants surgery and is scheduled for outpatient procedure

## 2017-09-06 NOTE — Progress Notes (Signed)
   Subjective:    Patient ID: CHIANTI GOH, female    DOB: 29-Sep-1961, 56 y.o.   MRN: [id number]  HPI    Review of Systems  All other systems reviewed and are negative.      Objective:   Physical Exam        Assessment & Plan:

## 2017-10-25 ENCOUNTER — Ambulatory Visit: Payer: BLUE CROSS/BLUE SHIELD | Admitting: Certified Nurse Midwife

## 2017-10-31 ENCOUNTER — Encounter: Payer: Self-pay | Admitting: Family Medicine

## 2017-10-31 ENCOUNTER — Ambulatory Visit (INDEPENDENT_AMBULATORY_CARE_PROVIDER_SITE_OTHER): Payer: BLUE CROSS/BLUE SHIELD | Admitting: Family Medicine

## 2017-10-31 VITALS — BP 122/76 | HR 58 | Temp 98.2°F | Ht 64.0 in | Wt 164.4 lb

## 2017-10-31 DIAGNOSIS — Z111 Encounter for screening for respiratory tuberculosis: Secondary | ICD-10-CM

## 2017-10-31 DIAGNOSIS — Z0000 Encounter for general adult medical examination without abnormal findings: Secondary | ICD-10-CM | POA: Diagnosis not present

## 2017-10-31 DIAGNOSIS — E559 Vitamin D deficiency, unspecified: Secondary | ICD-10-CM

## 2017-10-31 DIAGNOSIS — G43009 Migraine without aura, not intractable, without status migrainosus: Secondary | ICD-10-CM

## 2017-10-31 DIAGNOSIS — G43909 Migraine, unspecified, not intractable, without status migrainosus: Secondary | ICD-10-CM | POA: Insufficient documentation

## 2017-10-31 MED ORDER — TOPIRAMATE 25 MG PO TABS: 75.0000 mg | ORAL_TABLET | Freq: Every day | ORAL | 6 refills | Status: DC

## 2017-10-31 MED ORDER — SUMATRIPTAN 20 MG/ACT NA SOLN: 20.0000 mg | NASAL | 6 refills | Status: DC | PRN

## 2017-10-31 NOTE — Patient Instructions (Addendum)
Refilled migraine medicines  See [place] back in 48 hours- schedule a nurse visit before you leave- has to be full 48 hours to read Tb test then they can give you the form you need to submit  I should be able to see labs that Melvia Heaps does  I like your idea of 10 lbs weight loss and maintenance at that point

## 2017-10-31 NOTE — Assessment & Plan Note (Signed)
supplement through GYN for deficiency. Patient sees her GYN early next year and declines labs until that time.

## 2017-10-31 NOTE — Progress Notes (Signed)
Phone: [phone]  Subjective:  Patient presents today for their annual physical and to establish care- prior patient of Dr. Sarajane Jews about 10 years ago Chief complaint-noted.   See problem oriented charting- ROS- full  review of systems was completed and negative except for: migraine headaches  The following were reviewed and entered/updated in epic: Past Medical History:  Diagnosis Date  . Allergy   . Endometriosis   . Infertility, female    IVF 12 years ago  . Migraine headache    topamax for prevention at 75mg , imitrex nasal spray 20 mg/act- up to 2 per day q2 hours  . Vitamin D deficiency    supplement through GYN   Patient Active Problem List   Diagnosis Date Noted  . Migraine headache     Priority: Medium  . Vitamin D deficiency     Priority: Low  . ALLERGIC RHINITIS 08/09/2007    Priority: Low   Past Surgical History:  Procedure Laterality Date  . HYDROCELE EXCISION  1988   hernia, right lower abd  . LAPAROSCOPY     removed left ovary    Family History  Problem Relation Age of Onset  . Breast cancer Mother 31       passed at 51  . Prostate cancer Father 36       lived into 22s  . Uterine cancer Sister 71       lives in [place]  . Kidney cancer Sister 25  . Depression Brother        suicide    Medications- reviewed and updated Current Outpatient Medications  Medication Sig Dispense Refill  . Cholecalciferol (VITAMIN D3 PO) Take 50 mcg by mouth daily.    Marland Kitchen ibuprofen (ADVIL,MOTRIN) 200 MG tablet Take 200 mg by mouth every 6 (six) hours as needed.    . SUMAtriptan (IMITREX) 20 MG/ACT nasal spray Place 1 spray (20 mg total) into the nose every 2 (two) hours as needed for migraine or headache. May repeat in 2 hours if headache persists 6 Inhaler 6  . topiramate (TOPAMAX) 25 MG tablet Take 3 tablets (75 mg total) by mouth daily. 90 tablet 6   No current facility-administered medications for this visit.     Allergies-reviewed and updated No Known  Allergies  Social History   Socioeconomic History  . Marital status: Married    Spouse name: None  . Number of children: 1  . Years of education: None  . Highest education level: None  Social Needs  . Financial resource strain: None  . Food insecurity - worry: None  . Food insecurity - inability: None  . Transportation needs - medical: None  . Transportation needs - non-medical: None  Occupational History  . Occupation: Pharmacist, hospital  Tobacco Use  . Smoking status: Never Smoker  . Smokeless tobacco: Never Used  Substance and Sexual Activity  . Alcohol use: Yes    Alcohol/week: 0.0 - 0.6 oz  . Drug use: No  . Sexual activity: No    Partners: Male    Birth control/protection: Condom  Other Topics Concern  . None  Social History Narrative   Married. 1 adopted child 14 year old son (from [place]) in 2018   Family friend of Dr. Carlean Purl- son's also are friends/go to school together      Oncologist at [employer]: times outside, walking dog, time with friends and family, following sons sports    Objective: BP 122/76   Pulse Marland Kitchen)  58   Temp 98.2 F (36.8 C) (Oral)   Ht 5\' 4"  (1.626 m)   Wt 164 lb 6.4 oz (74.6 kg)   LMP 10/22/2017   SpO2 96%   BMI 28.22 kg/m  Gen: NAD, resting comfortably HEENT: Mucous membranes are moist. Oropharynx normal Neck: no thyromegaly CV: RRR no murmurs rubs or gallops Lungs: CTAB no crackles, wheeze, rhonchi Abdomen: soft/nontender/nondistended/normal bowel sounds. No rebound or guarding.  Ext: no edema Skin: warm, dry Neuro: grossly normal, moves all extremities, PERRLA  Assessment/Plan:  56 y.o. female presenting for annual physical.  Health Maintenance counseling: 1. Anticipatory guidance: Patient counseled regarding regular dental exams -q6 months, eye exams - yearly, wearing seatbelts.  2. Risk factor reduction:  Advised patient of need for regular exercise and diet rich and fruits and vegetables to reduce risk of  heart attack and stroke. Exercise- walking 5 days a week. Diet-reasonable, would like to be about 10 lbs lighter.  Wt Readings from Last 3 Encounters:  10/31/17 164 lb 6.4 oz (74.6 kg)  12/18/16 160 lb (72.6 kg)  10/24/16 161 lb (73 kg)  3. Immunizations/screenings/ancillary studies- can get shingrix when we have it available Immunization History  Administered Date(s) Administered  . Influenza,inj,Quad PF,6+ Mos 08/22/2017  . Influenza-Unspecified 08/20/2014  . PPD Test 10/31/2017  . Tdap 02/21/2012  4. Cervical cancer screening- 10-19-15 with 3 year repeat.  5. Breast cancer screening-  breast exam with GYN and mammogram 12/18/16- dense breasts. Family history breast cancer in mother 74. Colon cancer screening - 10/23/12 with 10 year repeat 7. Skin cancer screening- no dermatologist. advised regular sunscreen use. Denies worrisome, changing, or new skin lesions.  8. Osteoporosis screening at 13- will plan on this  Status of chronic or acute concerns   Last lipids 2016 normal- discussed 2021 repeat for q5 year evaluation  Migraine headache S: has seen Dr. Domingo Cocking of headache clinic for long time- transfering here due to high costs of specialist visits for patient. topamax for prevention at 75mg , imitrex nasal spray 20 mg/act- up to 2 per day q2 hours.   She states prior to migraines she would have days on end potentially 5 or more of migraine headaches after period. Now it is down to about 2 days. Imitrex gives some relief as well as ibuprofen but always gets rebound during those periods A/P: refilled medications- glad patient is doing well.    Vitamin D deficiency supplement through GYN for deficiency. Patient sees her GYN early next year and declines labs until that time.   I have placed a physical form for teaching on Jamie's desk. Will complete once Tb read.   Future Appointments  Date Time Provider [place]  11/02/2017  3:45 PM [facility] NURSE [facility]  11/22/2017   2:45 PM Trula Slade, DPM [facility]  12/05/2017  2:15 PM [facility] NURSE [facility]  12/19/2017  8:45 AM Regina Eck, CNM [facility]   1 year CPE  Orders Placed This Encounter  Procedures  . TB Skin Test    Order Specific Question:   Has patient ever tested positive?    Answer:   No   Meds ordered this encounter  Medications  . topiramate (TOPAMAX) 25 MG tablet    Sig: Take 3 tablets (75 mg total) by mouth daily.    Dispense:  90 tablet    Refill:  6  . SUMAtriptan (IMITREX) 20 MG/ACT nasal spray    Sig: Place 1 spray (20 mg total) into  the nose every 2 (two) hours as needed for migraine or headache. May repeat in 2 hours if headache persists    Dispense:  6 Inhaler    Refill:  6    Return precautions advised.  Garret Reddish, MD

## 2017-10-31 NOTE — Assessment & Plan Note (Signed)
S: has seen Dr. Domingo Cocking of headache clinic for long time- transfering here due to high costs of specialist visits for patient. topamax for prevention at 75mg , imitrex nasal spray 20 mg/act- up to 2 per day q2 hours.   She states prior to migraines she would have days on end potentially 5 or more of migraine headaches after period. Now it is down to about 2 days. Imitrex gives some relief as well as ibuprofen but always gets rebound during those periods A/P: refilled medications- glad patient is doing well.

## 2017-11-02 ENCOUNTER — Ambulatory Visit: Payer: BLUE CROSS/BLUE SHIELD

## 2017-11-02 DIAGNOSIS — Z111 Encounter for screening for respiratory tuberculosis: Secondary | ICD-10-CM

## 2017-11-02 LAB — TB SKIN TEST
Induration: 0 mm
TB Skin Test: NEGATIVE

## 2017-11-02 NOTE — Progress Notes (Signed)
Patient returned to office for PPD reading.  Negative result.

## 2017-11-03 NOTE — Progress Notes (Signed)
I have reviewed and agree with note, evaluation, plan.   Staff gave patient completed [place] form at time of visit.   Garret Reddish, MD

## 2017-11-12 ENCOUNTER — Telehealth: Payer: Self-pay | Admitting: *Deleted

## 2017-11-12 NOTE — Telephone Encounter (Signed)
"  I am calling to see what time I need to be at the surgery center tomorrow for my surgery."  Please call the surgical center to get your arrival time.  Their phone number is [phone].  "I'll give them a call, thanks."

## 2017-11-13 ENCOUNTER — Encounter: Payer: Self-pay | Admitting: Podiatry

## 2017-11-13 DIAGNOSIS — G5761 Lesion of plantar nerve, right lower limb: Secondary | ICD-10-CM | POA: Diagnosis not present

## 2017-11-15 ENCOUNTER — Encounter: Payer: Self-pay | Admitting: Podiatry

## 2017-11-22 ENCOUNTER — Encounter: Payer: Self-pay | Admitting: Podiatry

## 2017-11-22 ENCOUNTER — Ambulatory Visit (INDEPENDENT_AMBULATORY_CARE_PROVIDER_SITE_OTHER): Payer: BLUE CROSS/BLUE SHIELD | Admitting: Podiatry

## 2017-11-22 VITALS — BP 104/65 | HR 65 | Temp 97.8°F

## 2017-11-22 DIAGNOSIS — D3610 Benign neoplasm of peripheral nerves and autonomic nervous system, unspecified: Secondary | ICD-10-CM

## 2017-11-23 NOTE — Progress Notes (Signed)
Subjective: Lisa Rice is a 56 y.o. is seen today in office s/p right 3rd interspace neruectomy preformed on 11/13/2017. They state their pain is minimal and she did not take any pain medication. She states that each day is getting better. She has been wearing the surgical shoe. Denies any systemic complaints such as fevers, chills, nausea, vomiting. No calf pain, chest pain, shortness of breath.   Objective: General: No acute distress, AAOx3  DP/PT pulses palpable 2/4, CRT < 3 sec to all digits.  Protective sensation intact. Motor function intact.  Right foot: Incision is well coapted without any evidence of dehiscence and sutures are intact. There is no surrounding erythema, ascending cellulitis, fluctuance, crepitus, malodor, drainage/purulence. There is minimal edema around the surgical site. There is no pain along the surgical site.  No other areas of tenderness to bilateral lower extremities.  No other open lesions or pre-ulcerative lesions.  No pain with calf compression, swelling, warmth, erythema.   Assessment and Plan:  Status post right foot surgery, doing well with no complications   -Treatment options discussed including all alternatives, risks, and complications -Incision is healing well. Antibiotic ointment and a bandage was applied. She can start to shower but continue with dressing changes as described -Continue surgical shoe -Ice/elevation -Pain medication as needed- she has not been taking this.  -Monitor for any clinical signs or symptoms of infection and DVT/PE and directed to call the office immediately should any occur or go to the ER. -Follow-up as scheduled with Dr. Paulla Dolly or sooner if any problems arise. In the meantime, encouraged to call the office with any questions, concerns, change in symptoms.   Celesta Gentile, DPM

## 2017-12-05 ENCOUNTER — Encounter: Payer: Self-pay | Admitting: Podiatry

## 2017-12-05 ENCOUNTER — Ambulatory Visit (INDEPENDENT_AMBULATORY_CARE_PROVIDER_SITE_OTHER): Payer: Managed Care, Other (non HMO) | Admitting: Podiatry

## 2017-12-05 DIAGNOSIS — D3610 Benign neoplasm of peripheral nerves and autonomic nervous system, unspecified: Secondary | ICD-10-CM

## 2017-12-05 DIAGNOSIS — M6740 Ganglion, unspecified site: Secondary | ICD-10-CM

## 2017-12-07 NOTE — Progress Notes (Signed)
Subjective:   Patient ID: Lisa Rice, female   DOB: 57 y.o.   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 Reappoint if any issues should occur

## 2017-12-19 ENCOUNTER — Encounter: Payer: Self-pay | Admitting: Certified Nurse Midwife

## 2017-12-19 ENCOUNTER — Other Ambulatory Visit: Payer: Self-pay

## 2017-12-19 ENCOUNTER — Ambulatory Visit (INDEPENDENT_AMBULATORY_CARE_PROVIDER_SITE_OTHER): Payer: Managed Care, Other (non HMO) | Admitting: Certified Nurse Midwife

## 2017-12-19 ENCOUNTER — Other Ambulatory Visit (HOSPITAL_COMMUNITY)
Admission: RE | Admit: 2017-12-19 | Discharge: 2017-12-19 | Disposition: A | Discharge status: Home / Self Care | Payer: Managed Care, Other (non HMO) | Source: Ambulatory Visit | Attending: Certified Nurse Midwife | Admitting: Certified Nurse Midwife

## 2017-12-19 VITALS — BP 104/70 | HR 68 | Resp 16 | Ht 64.5 in | Wt 159.0 lb

## 2017-12-19 DIAGNOSIS — Z0000 Encounter for general adult medical examination without abnormal findings: Secondary | ICD-10-CM | POA: Diagnosis not present

## 2017-12-19 DIAGNOSIS — E559 Vitamin D deficiency, unspecified: Secondary | ICD-10-CM | POA: Diagnosis not present

## 2017-12-19 DIAGNOSIS — R6889 Other general symptoms and signs: Secondary | ICD-10-CM | POA: Diagnosis not present

## 2017-12-19 DIAGNOSIS — Z124 Encounter for screening for malignant neoplasm of cervix: Secondary | ICD-10-CM

## 2017-12-19 DIAGNOSIS — Z01419 Encounter for gynecological examination (general) (routine) without abnormal findings: Secondary | ICD-10-CM | POA: Diagnosis not present

## 2017-12-19 NOTE — Patient Instructions (Signed)

## 2017-12-19 NOTE — Progress Notes (Signed)
57 y.o. G0P0000 Married  Caucasian Fe here for annual exam. Perimenopausal with cycle changes. Periods coming regular lasting 2 days heavy and then light and finished. Previous lasted 4-5 days with heavier occurrence. Denies hot flash or night sweats at this point. Has noted some body temperature changes. Denies vaginal dryness. Just associated with spouse PCP Dr. Yong Channel. He will work with her regarding headache medication and have access to care. Happy with choice.  Patient's last menstrual period was 12/02/2017 (exact date).          Sexually active: No.  The current method of family planning is abstinence.    Exercising: Yes.    walking Smoker:  no  Health Maintenance: Pap:  10-19-15 neg History of Abnormal Pap: no MMG:  12-18-16 category  c density birads 1:neg Self Breast exams: occ Colonoscopy:  2013 f/u 32yrs BMD:   none TDaP:  2013 Shingles: no Pneumonia: no, Flu vaccine yes Hep C and HIV: Hep c neg 2017 Labs: yes   reports that  has never smoked. she has never used smokeless tobacco. She reports that she drinks alcohol. She reports that she does not use drugs.  Past Medical History:  Diagnosis Date  . Allergy   . Endometriosis   . Infertility, female    IVF 12 years ago  . Migraine headache    topamax for prevention at 75mg , imitrex nasal spray 20 mg/act- up to 2 per day q2 hours  . Vitamin D deficiency    supplement through GYN    Past Surgical History:  Procedure Laterality Date  . HYDROCELE EXCISION  1988   hernia, right lower abd  . LAPAROSCOPY     removed left ovary    Current Outpatient Medications  Medication Sig Dispense Refill  . Acetaminophen (TYLENOL PO) Take by mouth.    . Cholecalciferol (VITAMIN D3 PO) Take 50 mcg by mouth daily.    . SUMAtriptan (IMITREX) 20 MG/ACT nasal spray Place 1 spray (20 mg total) into the nose every 2 (two) hours as needed for migraine or headache. May repeat in 2 hours if headache persists 6 Inhaler 6  . topiramate  (TOPAMAX) 25 MG tablet Take 3 tablets (75 mg total) by mouth daily. 90 tablet 6   No current facility-administered medications for this visit.     Family History  Problem Relation Age of Onset  . Breast cancer Mother 29       passed at 14  . Prostate cancer Father 105       lived into 51s  . Uterine cancer Sister 32       lives in [place]  . Kidney cancer Sister 25  . Depression Brother        suicide    ROS:  Pertinent items are noted in HPI.  Otherwise, a comprehensive ROS was negative.  Exam:   BP 104/70   Pulse 68   Resp 16   Ht 5' 4.5" (1.638 m)   Wt 159 lb (72.1 kg)   LMP 12/02/2017 (Exact Date)   BMI 26.87 kg/m  Height: 5' 4.5" (163.8 cm) Ht Readings from Last 3 Encounters:  12/19/17 5' 4.5" (1.638 m)  10/31/17 5\' 4"  (1.626 m)  12/18/16 5\' 4"  (1.626 m)    General appearance: alert, cooperative and appears stated age Head: Normocephalic, without obvious abnormality, atraumatic Neck: no adenopathy, supple, symmetrical, trachea midline and thyroid normal to inspection and palpation Lungs: clear to auscultation bilaterally Breasts: normal appearance, no masses or tenderness, No  nipple retraction or dimpling, No nipple discharge or bleeding, No axillary or supraclavicular adenopathy Heart: regular rate and rhythm Abdomen: soft, non-tender; no masses,  no organomegaly Extremities: extremities normal, atraumatic, no cyanosis or edema Skin: Skin color, texture, turgor normal. No rashes or lesions Lymph nodes: Cervical, supraclavicular, and axillary nodes normal. No abnormal inguinal nodes palpated Neurologic: Grossly normal   Pelvic: External genitalia:  no lesions              Urethra:  normal appearing urethra with no masses, tenderness or lesions              Bartholin's and Skene's: normal                 Vagina: normal appearing vagina with normal color and discharge, no lesions              Cervix: multiparous appearance, no cervical motion tenderness and  no lesions              Pap taken: Yes.   Bimanual Exam:  Uterus:  normal size, contour, position, consistency, mobility, non-tender and anteverted              Adnexa: normal adnexa and no mass, fullness, tenderness               Rectovaginal: Confirms               Anus:  normal sphincter tone, no lesions  Chaperone present: yes  A:  Well Woman with normal exam  Contraception abstinence  Perimenopausal with cycle changes  Colonoscopy due?  Family history of breast cancer mother age 55  Screening labs  P:   Reviewed health and wellness pertinent to exam  Discussed perimenopause/menopause etiology/ bleeding expectations and cycle changes. Given printed information to read. Discussed if misses 3 periods needs to advise. Questions answered.  Patient will call to verify due and is so will schedule  Aware of importance of SBE and mammogram  Labs: CMP, Lipid panel, TSH, Vitamin D  Pap smear: yes   counseled on breast self exam, mammography screening, feminine hygiene, menopause, adequate intake of calcium and vitamin D, diet and exercise  return annually or prn  An After Visit Summary was printed and given to the patient.

## 2017-12-20 LAB — COMPREHENSIVE METABOLIC PANEL
ALT: 13 IU/L (ref 0–32)
AST: 15 IU/L (ref 0–40)
Albumin/Globulin Ratio: 1.9 (ref 1.2–2.2)
Albumin: 4.6 g/dL (ref 3.5–5.5)
Alkaline Phosphatase: 61 IU/L (ref 39–117)
BUN/Creatinine Ratio: 11 (ref 9–23)
BUN: 11 mg/dL (ref 6–24)
Bilirubin Total: 0.5 mg/dL (ref 0.0–1.2)
CALCIUM: 9.1 mg/dL (ref 8.7–10.2)
CO2: 21 mmol/L (ref 20–29)
CREATININE: 1 mg/dL (ref 0.57–1.00)
Chloride: 106 mmol/L (ref 96–106)
GFR calc Af Amer: 74 mL/min/{1.73_m2} (ref >59)
GFR, EST NON AFRICAN AMERICAN: 64 mL/min/{1.73_m2} (ref >59)
GLOBULIN, TOTAL: 2.4 g/dL (ref 1.5–4.5)
Glucose: 67 mg/dL (ref 65–99)
Potassium: 4.7 mmol/L (ref 3.5–5.2)
Sodium: 143 mmol/L (ref 134–144)
Total Protein: 7 g/dL (ref 6.0–8.5)

## 2017-12-20 LAB — LIPID PANEL
CHOL/HDL RATIO: 3.7 ratio (ref 0.0–4.4)
Cholesterol, Total: 178 mg/dL (ref 100–199)
HDL: 48 mg/dL (ref >39)
LDL CALC: 108 mg/dL — AB (ref 0–99)
Triglycerides: 110 mg/dL (ref 0–149)
VLDL Cholesterol Cal: 22 mg/dL (ref 5–40)

## 2017-12-20 LAB — CYTOLOGY - PAP
DIAGNOSIS: NEGATIVE
HPV (WINDOPATH): NOT DETECTED

## 2017-12-20 LAB — VITAMIN D 25 HYDROXY (VIT D DEFICIENCY, FRACTURES): Vit D, 25-Hydroxy: 33.7 ng/mL (ref 30.0–100.0)

## 2017-12-20 LAB — TSH: TSH: 0.98 u[IU]/mL (ref 0.450–4.500)

## 2017-12-21 ENCOUNTER — Telehealth: Payer: Self-pay

## 2017-12-21 NOTE — Telephone Encounter (Signed)
-----   Message from Regina Eck, CNM sent at 12/21/2017  7:37 AM EST ----- Pap smear reviewed negative, HPVHR not detected 02

## 2017-12-21 NOTE — Telephone Encounter (Signed)
Patient notified of results. See lab 

## 2017-12-21 NOTE — Telephone Encounter (Signed)
[email or web address]

## 2017-12-28 NOTE — Progress Notes (Signed)
DOS 12.18.18 Excision soft tissue mass 3rd interspace Rt foot

## 2018-08-26 ENCOUNTER — Other Ambulatory Visit: Payer: Self-pay | Admitting: Family Medicine

## 2019-01-01 ENCOUNTER — Ambulatory Visit: Payer: Managed Care, Other (non HMO) | Admitting: Certified Nurse Midwife

## 2019-01-17 ENCOUNTER — Other Ambulatory Visit: Payer: Self-pay

## 2019-01-17 ENCOUNTER — Ambulatory Visit (INDEPENDENT_AMBULATORY_CARE_PROVIDER_SITE_OTHER): Payer: Managed Care, Other (non HMO) | Admitting: Certified Nurse Midwife

## 2019-01-17 ENCOUNTER — Encounter: Payer: Self-pay | Admitting: Certified Nurse Midwife

## 2019-01-17 VITALS — BP 110/76 | HR 68 | Resp 16 | Ht 64.5 in | Wt 183.0 lb

## 2019-01-17 DIAGNOSIS — Z01419 Encounter for gynecological examination (general) (routine) without abnormal findings: Secondary | ICD-10-CM

## 2019-01-17 DIAGNOSIS — R899 Unspecified abnormal finding in specimens from other organs, systems and tissues: Secondary | ICD-10-CM

## 2019-01-17 DIAGNOSIS — E559 Vitamin D deficiency, unspecified: Secondary | ICD-10-CM

## 2019-01-17 DIAGNOSIS — Z3202 Encounter for pregnancy test, result negative: Secondary | ICD-10-CM

## 2019-01-17 DIAGNOSIS — N951 Menopausal and female climacteric states: Secondary | ICD-10-CM

## 2019-01-17 DIAGNOSIS — N912 Amenorrhea, unspecified: Secondary | ICD-10-CM

## 2019-01-17 LAB — POCT URINE PREGNANCY: PREG TEST UR: NEGATIVE

## 2019-01-17 NOTE — Patient Instructions (Signed)

## 2019-01-17 NOTE — Progress Notes (Signed)
58 y.o. G0P0000 1 adopted son Married  Caucasian Fe here for annual exam. Periods have changed, last one 11/07/18. Occasional what she feels maybe a hot flash. Felt like she was having period, but no bleeding. More emotional also.Migraine headaches are less, continues with Topamax with MD management.. Has also had weight change with 21 pounds gain with work stress and was stress eating. Now on weight loss and excercise. Sees PCP prn. No other health issues today.  Patient's last menstrual period was 11/07/2018 (exact date).          Sexually active: Yes.    The current method of family planning is none.    Exercising: Yes.    walking Smoker:  no  Review of Systems  Constitutional: Negative.   HENT: Negative.   Eyes: Negative.   Respiratory: Negative.   Cardiovascular: Negative.   Gastrointestinal: Negative.   Genitourinary:       Irregular cycle  Musculoskeletal: Negative.   Skin: Negative.   Neurological: Negative.   Endo/Heme/Allergies: Negative.   Psychiatric/Behavioral: Negative.     Health Maintenance: Pap:  10-19-15 neg, 12-19-17 neg HPV HR neg History of Abnormal Pap: no MMG:  12-18-16 category c density birads 1:neg Self Breast exams: occ Colonoscopy:  2013  Negative 10 years BMD:   none TDaP:  2013 Shingles: no Pneumonia: no Hep C and HIV: Hep c neg 2017 Labs: poct upt-neg   reports that she has never smoked. She has never used smokeless tobacco. She reports current alcohol use of about 1.0 - 2.0 standard drinks of alcohol per week. She reports that she does not use drugs.  Past Medical History:  Diagnosis Date  . Allergy   . Endometriosis   . Infertility, female    IVF 12 years ago  . Migraine headache    topamax for prevention at 75mg , imitrex nasal spray 20 mg/act- up to 2 per day q2 hours  . Vitamin D deficiency    supplement through GYN    Past Surgical History:  Procedure Laterality Date  . FOOT SURGERY     both feet  . HYDROCELE EXCISION  1988   hernia, right lower abd  . LAPAROSCOPY     removed left ovary    Current Outpatient Medications  Medication Sig Dispense Refill  . Acetaminophen (TYLENOL PO) Take by mouth.    . Cholecalciferol (VITAMIN D3 PO) Take 2,000 Int'l Units by mouth daily.     . SUMAtriptan (IMITREX) 20 MG/ACT nasal spray Place 1 spray (20 mg total) into the nose every 2 (two) hours as needed for migraine or headache. May repeat in 2 hours if headache persists 6 Inhaler 6  . topiramate (TOPAMAX) 25 MG tablet TAKE THREE TABLETS BY MOUTH DAILY 90 tablet 5   No current facility-administered medications for this visit.     Family History  Problem Relation Age of Onset  . Breast cancer Mother 65       passed at 53  . Prostate cancer Father 33       lived into 21s  . Uterine cancer Sister 81       lives in [place]  . Kidney cancer Sister 61  . Depression Brother        suicide    ROS:  Pertinent items are noted in HPI.  Otherwise, a comprehensive ROS was negative.  Exam:   BP 110/76   Pulse 68   Resp 16   Ht 5' 4.5" (1.638 m)   Wt 183  lb (83 kg)   LMP 11/07/2018 (Exact Date)   BMI 30.93 kg/m  Height: 5' 4.5" (163.8 cm) Ht Readings from Last 3 Encounters:  01/17/19 5' 4.5" (1.638 m)  12/19/17 5' 4.5" (1.638 m)  10/31/17 5\' 4"  (1.626 m)    General appearance: alert, cooperative and appears stated age Head: Normocephalic, without obvious abnormality, atraumatic Neck: no adenopathy, supple, symmetrical, trachea midline and thyroid normal to inspection and palpation Lungs: clear to auscultation bilaterally Breasts: normal appearance, no masses or tenderness, No nipple retraction or dimpling, No nipple discharge or bleeding, No axillary or supraclavicular adenopathy Heart: regular rate and rhythm Abdomen: soft, non-tender; no masses,  no organomegaly Extremities: extremities normal, atraumatic, no cyanosis or edema Skin: Skin color, texture, turgor normal. No rashes or lesions Lymph nodes:  Cervical, supraclavicular, and axillary nodes normal. No abnormal inguinal nodes palpated Neurologic: Grossly normal   Pelvic: External genitalia:  no lesions              Urethra:  normal appearing urethra with no masses, tenderness or lesions              Bartholin's and Skene's: normal                 Vagina: normal appearing vagina with normal color and discharge, no lesions              Cervix: no cervical motion tenderness, no lesions and normal appearance              Pap taken: No. Bimanual Exam:  Uterus:  normal size, contour, position, consistency, mobility, non-tender and anteverted              Adnexa: normal adnexa and no mass, fullness, tenderness               Rectovaginal: Confirms               Anus:  normal sphincter tone, no lesions  Chaperone present: yes  A:  Well Woman with normal exam  Amenorrhea negative UPT  Perimenopausal symptoms  Sees MD for migraine headache management, have decreased  Mammogram overdue  Screening labs  P:   Reviewed health and wellness pertinent to exam  Discussed perimenopause/menopause and etiology and expectations with bleeding. Discussed weight change, thyroid and pituitary effect on cycle also. Recommend labs today. If no period by March needs to advise. Discussed may need Provera challenge.  Printed information given.  Lab: FSH,TSH, Prolactin  Continue follow up with MD as indicated  Stressed mammogram importance.  Lab: Lipid panel  Pap smear: yes   counseled on breast self exam, mammography screening, menopause, adequate intake of calcium and vitamin D, diet and exercise  return annually or prn  An After Visit Summary was printed and given to the patient.

## 2019-01-18 LAB — LIPID PANEL
CHOLESTEROL TOTAL: 193 mg/dL (ref 100–199)
Chol/HDL Ratio: 3.6 ratio (ref 0.0–4.4)
HDL: 54 mg/dL (ref >39)
LDL Calculated: 117 mg/dL — ABNORMAL HIGH (ref 0–99)
TRIGLYCERIDES: 110 mg/dL (ref 0–149)
VLDL CHOLESTEROL CAL: 22 mg/dL (ref 5–40)

## 2019-01-18 LAB — TSH: TSH: 1.7 u[IU]/mL (ref 0.450–4.500)

## 2019-01-18 LAB — VITAMIN D 25 HYDROXY (VIT D DEFICIENCY, FRACTURES): Vit D, 25-Hydroxy: 34.5 ng/mL (ref 30.0–100.0)

## 2019-01-18 LAB — FOLLICLE STIMULATING HORMONE: FSH: 8 m[IU]/mL

## 2019-01-18 LAB — PROLACTIN: Prolactin: 18 ng/mL (ref 4.8–23.3)

## 2019-01-22 ENCOUNTER — Telehealth: Payer: Self-pay

## 2019-01-22 NOTE — Telephone Encounter (Signed)
Patient notified of results. See lab 

## 2019-01-22 NOTE — Telephone Encounter (Signed)
Patient returned call

## 2019-01-22 NOTE — Telephone Encounter (Signed)
-----   Message from Regina Eck, CNM sent at 01/20/2019  8:52 AM EST ----- Notify patient her TSH is normal, Prolactin is normal [place] does not show menopause at this point. If no period by end of March needs to advise, will need Provera challenge.  Vitamin D is normal range,  Lipid panel Normal except for borderline elevation of LDL.  Work on good intake of fruits/vegetables and lean meat and fish.  Recheck at next aex

## 2019-01-22 NOTE — Telephone Encounter (Signed)
Left message for call back.

## 2019-03-06 ENCOUNTER — Telehealth: Payer: Self-pay

## 2019-03-06 NOTE — Telephone Encounter (Signed)
Called pt to schedule an appt. Pt has not been seen since 2018.

## 2019-03-10 ENCOUNTER — Other Ambulatory Visit: Payer: Self-pay

## 2019-03-10 ENCOUNTER — Telehealth: Payer: Self-pay | Admitting: Family Medicine

## 2019-03-10 ENCOUNTER — Other Ambulatory Visit: Payer: Self-pay | Admitting: Family Medicine

## 2019-03-10 MED ORDER — TOPIRAMATE 25 MG PO TABS: 75.0000 mg | ORAL_TABLET | Freq: Every day | ORAL | 0 refills | Status: DC

## 2019-03-10 NOTE — Telephone Encounter (Signed)
Spoke to pt and scheduled appt. Rx sent to pharmacy. No further action needed at this time!

## 2019-03-10 NOTE — Telephone Encounter (Signed)
Patient called back returning [place] phone call

## 2019-03-10 NOTE — Telephone Encounter (Signed)
See note

## 2019-03-11 ENCOUNTER — Encounter: Payer: Self-pay | Admitting: Family Medicine

## 2019-03-11 ENCOUNTER — Ambulatory Visit (INDEPENDENT_AMBULATORY_CARE_PROVIDER_SITE_OTHER): Payer: Managed Care, Other (non HMO) | Admitting: Family Medicine

## 2019-03-11 VITALS — Temp 98.6°F | Ht 64.5 in | Wt 175.0 lb

## 2019-03-11 DIAGNOSIS — E785 Hyperlipidemia, unspecified: Secondary | ICD-10-CM

## 2019-03-11 DIAGNOSIS — G43009 Migraine without aura, not intractable, without status migrainosus: Secondary | ICD-10-CM | POA: Diagnosis not present

## 2019-03-11 MED ORDER — SUMATRIPTAN 20 MG/ACT NA SOLN: 20.0000 mg | NASAL | 10 refills | Status: DC | PRN

## 2019-03-11 MED ORDER — TOPIRAMATE 25 MG PO TABS: 75.0000 mg | ORAL_TABLET | Freq: Every day | ORAL | 3 refills | Status: DC

## 2019-03-11 NOTE — Patient Instructions (Addendum)
Health Maintenance Due  Topic Date Due  . MAMMOGRAM Pt stated this will be scheduled soon after Covid-19. Pt was advised to sign a ROI to have this sent to our office after visit 12/18/2018   Staff did not complete phq2 - will plan on this at future visit

## 2019-03-11 NOTE — Progress Notes (Signed)
Phone [phone]   Subjective:  Virtual visit via Video note. Chief complaint: Chief Complaint  Patient presents with  . Follow-up    This visit type was conducted due to national recommendations for restrictions regarding the COVID-19 Pandemic (e.g. social distancing).  This format is felt to be most appropriate for this patient at this time balancing risks to patient and risks to population by having him in for in person visit.  No physical exam was performed (except for noted visual exam or audio findings with Telehealth visits).    Our team/I connected with Odelia Gage on 03/11/19 at  1:40 PM EDT by a video enabled telemedicine application ([email or web address]) and verified that I am speaking with the correct person using two identifiers.  Location patient: [place] Location provider: [facility], office Persons participating in the virtual visit:  patient  Our team/I discussed the limitations of evaluation and management by telemedicine and the availability of in person appointments. In light of current covid-19 pandemic, patient also understands that we are trying to protect them by minimizing in office contact if at all possible.  The patient expressed consent for telemedicine visit and agreed to proceed. Patient understands insurance will be billed.   ROS- rare migraines. No chest pain or shortness of breath reported. No edema reproted.    Past Medical History-  Patient Active Problem List   Diagnosis Date Noted  . Migraine headache     Priority: Medium  . Vitamin D deficiency     Priority: Low  . ALLERGIC RHINITIS 08/09/2007    Priority: Low  . Mild hyperlipidemia 03/11/2019  . Encounter for PPD skin test reading 11/02/2017    Medications- reviewed and updated Current Outpatient Medications  Medication Sig Dispense Refill  . Acetaminophen (TYLENOL PO) Take by mouth.    . Cholecalciferol (VITAMIN D3 PO) Take 2,000 Int'l Units by mouth daily.     . SUMAtriptan (IMITREX) 20  MG/ACT nasal spray Place 1 spray (20 mg total) into the nose every 2 (two) hours as needed for migraine or headache. May repeat in 2 hours if headache persists 6 Inhaler 10  . topiramate (TOPAMAX) 25 MG tablet Take 3 tablets (75 mg total) by mouth daily. If possible- give 3 months supply with covid 19 270 tablet 3   No current facility-administered medications for this visit.      Objective:  Temp 98.6 F (37 C) (Oral)   Ht 5' 4.5" (1.638 m)   Wt 175 lb (79.4 kg)   BMI 29.57 kg/m  Gen: NAD, resting comfortably Lungs: non labored, normal respiratory rate  Skin: appears dry, no obvious rash     Assessment and Plan    # Migraine headache S:sleeping a little better and has found that helpful. Has had a few since Christmas but maybe less intense than usual. 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  Has been able to get rid of headache with 1 squirt of zomig.  1 topiramate in morning and 2 at night.   Hoping once she goes through menopause that headaches will improve.  A/P: Stable. Continue current medications. Refills provided for 1 year   #hyperlipidemia/overweight S: mild poorly controlled on no rx. Had gotten down to better weight but has regained. She is back exercising now and has noted some improvements.  Lab Results  Component Value Date   CHOL 193 01/17/2019   HDL 54 01/17/2019   LDLCALC 117 (H) 01/17/2019   TRIG 110  01/17/2019   CHOLHDL 3.6 01/17/2019   A/P: patient was not aware of cholesterol issues - discussed ascvd risk 1.5% still low but want  To be proactive about healthy eating/regular exercise to try to keep risk low. Continue without rx. For overweight- Encouraged need for healthy eating, regular exercise, weight loss.   Other notes: 1.mammogram- she plans to get this scheduled in next month or two  2. Last cbc 2016- other labs monitored by GYN- they are doing a great job- appreciate their help  Future Appointments  Date Time Provider  [place]  01/23/2020  8:00 AM Regina Eck, CNM [place] None   Likely 1 year follow up  Lab/Order associations: Migraine without aura and without status migrainosus, not intractable  Mild hyperlipidemia  Meds ordered this encounter  Medications  . SUMAtriptan (IMITREX) 20 MG/ACT nasal spray    Sig: Place 1 spray (20 mg total) into the nose every 2 (two) hours as needed for migraine or headache. May repeat in 2 hours if headache persists    Dispense:  6 Inhaler    Refill:  10  . topiramate (TOPAMAX) 25 MG tablet    Sig: Take 3 tablets (75 mg total) by mouth daily. If possible- give 3 months supply with covid 19    Dispense:  270 tablet    Refill:  3   return precautions advised.  Garret Reddish, MD

## 2019-11-17 ENCOUNTER — Telehealth: Payer: Managed Care, Other (non HMO) | Admitting: Family

## 2019-11-17 DIAGNOSIS — Z20822 Contact with and (suspected) exposure to covid-19: Secondary | ICD-10-CM

## 2019-11-17 MED ORDER — ALBUTEROL SULFATE HFA 108 (90 BASE) MCG/ACT IN AERS: 2.0000 | INHALATION_SPRAY | Freq: Four times a day (QID) | RESPIRATORY_TRACT | 0 refills | Status: DC | PRN

## 2019-11-17 NOTE — Progress Notes (Signed)
E-Visit for Corona Virus Screening   Your current symptoms could be consistent with the coronavirus.  Many health care providers can now test patients at their office but not all are.  [place] has multiple testing sites. For information on our [place] testing locations and hours go to [email or web address]  We are enrolling you in our [place] for [place] . Daily you will receive a questionnaire within the [place] website. Our COVID 19 response team will be monitoring your responses daily.  Testing Information: The COVID-19 Community Testing sites will begin testing BY APPOINTMENT ONLY.  You can schedule online at [email or web address]  If you do not have access to a smart phone or computer you may call [phone] for an appointment.  Testing Locations: Appointment schedule is 8 am to 3:30 pm at all sites  [facility] indoors at [address] [place] [address] [facility]  indoors at [place]. [address], [place], [place] [address] [place] indoors at [address], [place] [address]  Additional testing sites in the Community:  . For CVS Testing sites in [facility]  FaceUpdate.uy  . For Pop-up testing sites in [place]  BowlDirectory.co.uk  . For Testing sites with regular hours [email or web address]  . For [place] [email or web address]  . For [facility] [email or web address]  . For [facility] testing in [place] and [employer] [email or web address]  . For [facility] testing in [facility]   [email or web address]  For  more  information about community testing call [phone]   We are enrolling you in our [place] for [place] . Daily you will receive a questionnaire within the [place] website. Our COVID 19 response team will be monitoring your responses daily.  Please quarantine yourself while awaiting your test results. If you develop fever/cough/breathlessness, please stay home for 10 days with improving symptoms and until you have had 24 hours of no fever (without taking a fever reducer).  You should wear a mask or cloth face covering over your nose and mouth if you must be around other people or animals, including pets (even at home). Try to stay at least 6 feet away from other people. This will protect the people around you.  Please continue good preventive care measures, including:  frequent hand-washing, avoid touching your face, cover coughs/sneezes, stay out of crowds and keep a 6 foot distance from others.  COVID-19 is a respiratory illness with symptoms that are similar to the flu. Symptoms are typically mild to moderate, but there have been cases of severe illness and death due to the virus.   The following symptoms may appear 2-14 days after exposure: . Fever . Cough . Shortness of breath or difficulty breathing . Chills . Repeated shaking with chills . Muscle pain . Headache . Sore throat . New loss of taste or smell . Fatigue . Congestion or runny nose . Nausea or vomiting . Diarrhea  Go to the nearest hospital ED for assessment if fever/cough/breathlessness are severe or illness seems like a threat to life.  It is vitally important that if you feel that you have an infection such as this virus or any other virus that you stay home and away from places where you may spread it to others.  You should avoid contact with people age 19 and older.   You can use medication such as A prescription inhaler called Albuterol MDI 90 mcg /actuation 2 puffs every 4 hours as needed for shortness  of breath, wheezing, cough  You may also take acetaminophen (Tylenol) as needed for fever.  Reduce your risk of any infection by using the same precautions used for avoiding the common cold or flu:  Marland Kitchen Wash your hands often with soap and warm water for at least 20 seconds.  If soap and water are not readily available, use an alcohol-based hand sanitizer with at least 60% alcohol.  . If coughing or sneezing, cover your mouth and nose by coughing or sneezing into the elbow areas of your shirt or coat, into a tissue or into your sleeve (not your hands). . Avoid shaking hands with others and consider head nods or verbal greetings only. . Avoid touching your eyes, nose, or mouth with unwashed hands.  . Avoid close contact with people who are sick. . Avoid places or events with large numbers of people in one location, like concerts or sporting events. . Carefully consider travel plans you have or are making. . If you are planning any travel outside or inside the [place], visit the CDC's Travelers' Health webpage for the latest health notices. . If you have some symptoms but not all symptoms, continue to monitor at home and seek medical attention if your symptoms worsen. . If you are having a medical emergency, call 911.  HOME CARE . Only take medications as instructed by your medical team. . Drink plenty of fluids and get plenty of rest. . A steam or ultrasonic humidifier can help if you have congestion.   GET HELP RIGHT AWAY IF YOU HAVE EMERGENCY WARNING SIGNS** FOR COVID-19. If you or someone is showing any of these signs seek emergency medical care immediately. Call 911 or proceed to your closest emergency facility if: . You develop worsening high fever. . Trouble breathing . Bluish lips or face . Persistent pain or pressure in the chest . New confusion . Inability to wake or stay awake . You cough up blood. . Your symptoms become more severe  **This list is not all possible symptoms. Contact  your medical provider for any symptoms that are sever or concerning to you.  MAKE SURE YOU   Understand these instructions.  Will watch your condition.  Will get help right away if you are not doing well or get worse.  Your e-visit answers were reviewed by a board certified advanced clinical practitioner to complete your personal care plan.  Depending on the condition, your plan could have included both over the counter or prescription medications.  If there is a problem please reply once you have received a response from your provider.  Your safety is important to [place].  If you have drug allergies check your prescription carefully.    You can use MyChart to ask questions about today's visit, request a non-urgent call back, or ask for a work or school excuse for 24 hours related to this e-Visit. If it has been greater than 24 hours you will need to follow up with your provider, or enter a new e-Visit to address those concerns. You will get an e-mail in the next two days asking about your experience.  I hope that your e-visit has been valuable and will speed your recovery. Thank you for using e-visits.   Approximately 5 minutes was spent documenting and reviewing patient's chart.

## 2019-11-18 ENCOUNTER — Telehealth: Payer: Managed Care, Other (non HMO) | Admitting: Emergency Medicine

## 2019-11-18 ENCOUNTER — Ambulatory Visit: Payer: Managed Care, Other (non HMO) | Attending: Internal Medicine

## 2019-11-18 DIAGNOSIS — R238 Other skin changes: Secondary | ICD-10-CM

## 2019-11-18 DIAGNOSIS — J069 Acute upper respiratory infection, unspecified: Secondary | ICD-10-CM

## 2019-11-18 DIAGNOSIS — U071 COVID-19: Secondary | ICD-10-CM

## 2019-11-18 MED ORDER — BENZONATATE 100 MG PO CAPS: 100.0000 mg | ORAL_CAPSULE | Freq: Two times a day (BID) | ORAL | 0 refills | Status: DC | PRN

## 2019-11-18 MED ORDER — CETIRIZINE HCL 10 MG PO TABS: 10.0000 mg | ORAL_TABLET | Freq: Every day | ORAL | 0 refills | Status: DC

## 2019-11-18 NOTE — Progress Notes (Signed)
E-Visit for Corona Virus Screening  Your current symptoms could be consistent with the coronavirus.  Many health care providers can now test patients at their office but not all are.  [place] has multiple testing sites. For information on our COVID testing locations and hours go to [email or web address]  It is unlikely that you have strep throat.  Strep generally presents as rapid onset sore throat, WITHOUT cough and WITH fever.  As such, you don't meet criteria for treatment for strep without a formal strep test.  That being said, I wouldn't recommend strep testing in your case.  You most likely have a typical cold, which we are seeing a lot of right now, but it is tricky to diagnose given the pandemic.  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  Please see the resources below.  In addition, you should isolate/quarantine until you have a negative test result, or if your test results are positive then you need to isolate for 10-14 days.   We are enrolling you in our [place] for [place] . Daily you will receive a questionnaire within the [place] website. Our COVID 19 response team will be monitoring your responses daily.  Testing Information: The COVID-19 Community Testing sites will begin testing BY APPOINTMENT ONLY.  You can schedule online at [email or web address]  If you do not have access to a smart phone or computer you may call [phone] for an appointment.  Testing Locations: Appointment schedule is 8 am to 3:30 pm at all sites  [facility] indoors at [address], [place] [address] [facility]  indoors at [place]. [address], [place], [place] [address] [place] indoors at [address] [place] [address]  Additional testing sites in the Community:  . For CVS Testing sites in [facility]  FaceUpdate.uy  . For Pop-up testing  sites in [place]  BowlDirectory.co.uk  . For Testing sites with regular hours [email or web address]  . For [place] [email or web address]  . For [facility] BasicJet.[email or web address]  . For [facility] testing in [place] and [employer] [email or web address]  . For [facility] testing in [facility]   [email or web address]  For  more information about community testing call [phone]   We are enrolling you in our [place] for [place] . Daily you will receive a questionnaire within the [place] website. Our COVID 19 response team will be monitoring your responses daily.  Please quarantine yourself while awaiting your test results. If you develop fever/cough/breathlessness, please stay home for 10 days with improving symptoms and until you have had 24 hours of no fever (without taking a fever reducer).  You should wear a mask or cloth face covering over your nose and mouth if you must be around other people or animals, including pets (even at home). Try to stay at least 6 feet away from other people. This will protect the people around you.  Please continue good preventive care measures, including:  frequent hand-washing, avoid touching your face, cover coughs/sneezes, stay out of crowds and keep a 6 foot distance from others.  COVID-19 is a respiratory illness with symptoms that are similar to the flu. Symptoms are typically mild to moderate, but there have been cases of severe illness and death due to the virus.   The following symptoms may appear 2-14 days after exposure: . Fever . Cough . Shortness of breath or difficulty  breathing .  Chills . Repeated shaking with chills . Muscle pain . Headache . Sore throat . New loss of taste or smell . Fatigue . Congestion or runny nose . Nausea or vomiting . Diarrhea  Go to the nearest hospital ED for assessment if fever/cough/breathlessness are severe or illness seems like a threat to life.  It is vitally important that if you feel that you have an infection such as this virus or any other virus that you stay home and away from places where you may spread it to others.  You should avoid contact with people age 93 and older.   You can use medication such as A prescription cough medication called Tessalon Perles 100 mg. You may take 1-2 capsules every 8 hours as needed for cough  I've also sent Zyrtec to your pharmacy.  This can help dry up nasal secretions that can cause sore throat and precipitate cough.  You may also take acetaminophen (Tylenol) as needed for fever.  Reduce your risk of any infection by using the same precautions used for avoiding the common cold or flu:  Marland Kitchen Wash your hands often with soap and warm water for at least 20 seconds.  If soap and water are not readily available, use an alcohol-based hand sanitizer with at least 60% alcohol.  . If coughing or sneezing, cover your mouth and nose by coughing or sneezing into the elbow areas of your shirt or coat, into a tissue or into your sleeve (not your hands). . Avoid shaking hands with others and consider head nods or verbal greetings only. . Avoid touching your eyes, nose, or mouth with unwashed hands.  . Avoid close contact with people who are sick. . Avoid places or events with large numbers of people in one location, like concerts or sporting events. . Carefully consider travel plans you have or are making. . If you are planning any travel outside or inside the [place], visit the CDC's Travelers' Health webpage for the latest health notices. . If you have some symptoms but not all symptoms, continue to  monitor at home and seek medical attention if your symptoms worsen. . If you are having a medical emergency, call 911.  HOME CARE . Only take medications as instructed by your medical team. . Drink plenty of fluids and get plenty of rest. . A steam or ultrasonic humidifier can help if you have congestion.   GET HELP RIGHT AWAY IF YOU HAVE EMERGENCY WARNING SIGNS** FOR COVID-19. If you or someone is showing any of these signs seek emergency medical care immediately. Call 911 or proceed to your closest emergency facility if: . You develop worsening high fever. . Trouble breathing . Bluish lips or face . Persistent pain or pressure in the chest . New confusion . Inability to wake or stay awake . You cough up blood. . Your symptoms become more severe  **This list is not all possible symptoms. Contact your medical provider for any symptoms that are sever or concerning to you.  MAKE SURE YOU   Understand these instructions.  Will watch your condition.  Will get help right away if you are not doing well or get worse.  Your e-visit answers were reviewed by a board certified advanced clinical practitioner to complete your personal care plan.  Depending on the condition, your plan could have included both over the counter or prescription medications.  If there is a problem please reply once you have received a response from your provider.  Your safety  is important to [place].  If you have drug allergies check your prescription carefully.    You can use MyChart to ask questions about today's visit, request a non-urgent call back, or ask for a work or school excuse for 24 hours related to this e-Visit. If it has been greater than 24 hours you will need to follow up with your provider, or enter a new e-Visit to address those concerns. You will get an e-mail in the next two days asking about your experience.  I hope that your e-visit has been valuable and will speed your recovery. Thank you for using  e-visits.   Greater than 5 minutes, yet less than 10 minutes was used in reviewing the patient's chart, questionnaire, prescribing medications, and documentation for this visit.

## 2019-11-20 LAB — NOVEL CORONAVIRUS, NAA: SARS-CoV-2, NAA: NOT DETECTED

## 2020-01-09 ENCOUNTER — Other Ambulatory Visit: Payer: Self-pay | Admitting: Certified Nurse Midwife

## 2020-01-09 DIAGNOSIS — Z1231 Encounter for screening mammogram for malignant neoplasm of breast: Secondary | ICD-10-CM

## 2020-01-19 ENCOUNTER — Other Ambulatory Visit (HOSPITAL_COMMUNITY)
Admission: RE | Admit: 2020-01-19 | Discharge: 2020-01-19 | Disposition: A | Discharge status: Home / Self Care | Payer: 59 | Source: Ambulatory Visit | Attending: Obstetrics & Gynecology | Admitting: Obstetrics & Gynecology

## 2020-01-19 ENCOUNTER — Other Ambulatory Visit: Payer: Self-pay

## 2020-01-19 ENCOUNTER — Ambulatory Visit: Payer: 59 | Admitting: Certified Nurse Midwife

## 2020-01-19 ENCOUNTER — Encounter: Payer: Self-pay | Admitting: Certified Nurse Midwife

## 2020-01-19 VITALS — BP 114/68 | HR 68 | Temp 98.1°F | Resp 16 | Ht 64.25 in | Wt 189.0 lb

## 2020-01-19 DIAGNOSIS — N951 Menopausal and female climacteric states: Secondary | ICD-10-CM

## 2020-01-19 DIAGNOSIS — Z0000 Encounter for general adult medical examination without abnormal findings: Secondary | ICD-10-CM

## 2020-01-19 DIAGNOSIS — N912 Amenorrhea, unspecified: Secondary | ICD-10-CM | POA: Diagnosis not present

## 2020-01-19 DIAGNOSIS — Z124 Encounter for screening for malignant neoplasm of cervix: Secondary | ICD-10-CM

## 2020-01-19 DIAGNOSIS — N898 Other specified noninflammatory disorders of vagina: Secondary | ICD-10-CM | POA: Diagnosis not present

## 2020-01-19 DIAGNOSIS — Z01419 Encounter for gynecological examination (general) (routine) without abnormal findings: Secondary | ICD-10-CM | POA: Diagnosis not present

## 2020-01-19 DIAGNOSIS — E559 Vitamin D deficiency, unspecified: Secondary | ICD-10-CM

## 2020-01-19 NOTE — Progress Notes (Signed)
59 y.o. G0P0000 Married  Caucasian Fe here for annual exam. Patient has been trying to keep up with her periods, which have been irregular. She is aware she had some in 2020, last one recorded in chart was 12/19  Periods did not occur in October, November and December. Occasional hot flash and night sweats.. Started  period in second week of January with 4-5 day duration slight heavy bleeding, with cramping, and then stopped. She has had another what she feels is period like blood for the past 3-4 days. Occasional cramping. Did not really keep up with exact dates.  Was aware she was to call if no menses, but Covid interfered and she forgot. Took flu vaccine and has appointment for Covid vaccine soon.[facility] PCP for migraine management which now seem to be less intense. Screening labs here today. No other health issues.  No LMP recorded. (Menstrual status: Irregular Periods).          Sexually active: No.  The current method of family planning is abstinence.    Exercising: Yes.    walking Smoker:  no  Review of Systems  Constitutional: Negative.   HENT: Negative.   Eyes: Negative.   Respiratory: Negative.   Cardiovascular: Negative.   Gastrointestinal: Negative.   Genitourinary: Negative.   Musculoskeletal: Negative.   Skin:       discharge  Neurological: Negative.   Endo/Heme/Allergies: Negative.   Psychiatric/Behavioral: Negative.     Health Maintenance: Pap:  12-19-17 neg HPV HR neg History of Abnormal Pap: no MMG:  12-18-16 category c density birads 1:neg, scheduled for 02-12-2020 Self Breast exams: occ Colonoscopy:  2013 f/u 69yrs BMD:   none TDaP:  2020 Shingles: 2020 Pneumonia: no Hep C and HIV: Hep c neg 2017 Labs: yes   reports that she has never smoked. She has never used smokeless tobacco. She reports current alcohol use of about 1.0 - 2.0 standard drinks of alcohol per week. She reports that she does not use drugs.  Past Medical History:  Diagnosis Date  . Allergy    . Endometriosis   . Infertility, female    IVF 12 years ago  . Migraine headache    topamax for prevention at 75mg , imitrex nasal spray 20 mg/act- up to 2 per day q2 hours  . Vitamin D deficiency    supplement through GYN    Past Surgical History:  Procedure Laterality Date  . FOOT SURGERY     both feet  . HYDROCELE EXCISION  1988   hernia, right lower abd  . LAPAROSCOPY     removed left ovary    Current Outpatient Medications  Medication Sig Dispense Refill  . Acetaminophen (TYLENOL PO) Take by mouth.    Marland Kitchen albuterol (VENTOLIN HFA) 108 (90 Base) MCG/ACT inhaler Inhale 2 puffs into the lungs every 6 (six) hours as needed for wheezing or shortness of breath. 8 g 0  . benzonatate (TESSALON) 100 MG capsule Take 1 capsule (100 mg total) by mouth 2 (two) times daily as needed for cough. 20 capsule 0  . cetirizine (ZYRTEC ALLERGY) 10 MG tablet Take 1 tablet (10 mg total) by mouth daily. 30 tablet 0  . Cholecalciferol (VITAMIN D3 PO) Take 2,000 Int'l Units by mouth daily.     . SUMAtriptan (IMITREX) 20 MG/ACT nasal spray Place 1 spray (20 mg total) into the nose every 2 (two) hours as needed for migraine or headache. May repeat in 2 hours if headache persists 6 Inhaler 10  .  topiramate (TOPAMAX) 25 MG tablet Take 3 tablets (75 mg total) by mouth daily. If possible- give 3 months supply with covid 19 270 tablet 3   No current facility-administered medications for this visit.    Family History  Problem Relation Age of Onset  . Breast cancer Mother 37       passed at 27  . Prostate cancer Father 73       lived into 46s  . Uterine cancer Sister 36       lives in [place]  . Kidney cancer Sister 48  . Depression Brother        suicide    ROS:  Pertinent items are noted in HPI.  Otherwise, a comprehensive ROS was negative.  Exam:   There were no vitals taken for this visit.   Ht Readings from Last 3 Encounters:  03/11/19 5' 4.5" (1.638 m)  01/17/19 5' 4.5" (1.638 m)   12/19/17 5' 4.5" (1.638 m)    General appearance: alert, cooperative and appears stated age Head: Normocephalic, without obvious abnormality, atraumatic Neck: no adenopathy, supple, symmetrical, trachea midline and thyroid normal to inspection and palpation Lungs: clear to auscultation bilaterally Breasts: normal appearance, no masses or tenderness, No nipple retraction or dimpling, No nipple discharge or bleeding, No axillary or supraclavicular adenopathy Heart: regular rate and rhythm Abdomen: soft, non-tender; no masses,  no organomegaly Extremities: extremities normal, atraumatic, no cyanosis or edema Skin: Skin color, texture, turgor normal. No rashes or lesions Lymph nodes: Cervical, supraclavicular, and axillary nodes normal. No abnormal inguinal nodes palpated Neurologic: Grossly normal   Pelvic: External genitalia:  no lesions              Urethra:  normal appearing urethra with no masses, tenderness or lesions              Bartholin's and Skene's: normal                 Vagina: normal appearing vagina with normal color and discharge, no lesions              Cervix: no cervical motion tenderness, no lesions and mucous like blood noted from cervix when obtaining pap smear              Pap taken: Yes.   Bimanual Exam:  Uterus:  normal size, contour, position, consistency, mobility, non-tender and mid position              Adnexa: normal adnexa and no mass, fullness, tenderness               Rectovaginal: Confirms               Anus:  normal sphincter tone, no lesions  Chaperone present: yes  A:  Well Woman with normal exam  Irregular periods with menopausal symptoms  Migraine headache management with MD, better controlled now  Family history of breast and uterine cancer  Screening labs    P:   Reviewed health and wellness pertinent to exam  Discussed importance again of keeping a calendar of menses and notifying if no period in 3 months. Discussed possible POP use to  prevent hyperplasia with irregular periods. Patient would consider if needed. Discussed thyroid, pituitary, and weight role in amenorrhea or irregular periods. Recommend labs TSH, FSH, serum HCG and Prolactin. Patient agreeable.  Continue follow up with MD as indicated.  Patient aware of importance of mammogram and SBE.  Labs: Lipid panel, CMP, Vitamin D, CBC  Pap smear: yes   counseled on breast self exam, mammography screening, feminine hygiene, menopause, adequate intake of calcium and vitamin D, diet and exercise  return annually or prn  An After Visit Summary was printed and given to the patient.

## 2020-01-19 NOTE — Patient Instructions (Signed)
EXERCISE AND DIET:  We recommended that you start or continue a regular exercise program for good health. Regular exercise means any activity that makes your heart beat faster and makes you sweat.  We recommend exercising at least 30 minutes per day at least 3 days a week, preferably 4 or 5.  We also recommend a diet low in fat and sugar.  Inactivity, poor dietary choices and obesity can cause diabetes, heart attack, stroke, and kidney damage, among others.    ALCOHOL AND SMOKING:  Women should limit their alcohol intake to no more than 7 drinks/beers/glasses of wine (combined, not each!) per week. Moderation of alcohol intake to this level decreases your risk of breast cancer and liver damage. And of course, no recreational drugs are part of a healthy lifestyle.  And absolutely no smoking or even second hand smoke. Most people know smoking can cause heart and lung diseases, but did you know it also contributes to weakening of your bones? Aging of your skin?  Yellowing of your teeth and nails?  CALCIUM AND VITAMIN D:  Adequate intake of calcium and Vitamin D are recommended.  The recommendations for exact amounts of these supplements seem to change often, but generally speaking 600 mg of calcium (either carbonate or citrate) and 800 units of Vitamin D per day seems prudent. Certain women may benefit from higher intake of Vitamin D.  If you are among these women, your doctor will have told you during your visit.    PAP SMEARS:  Pap smears, to check for cervical cancer or precancers,  have traditionally been done yearly, although recent scientific advances have shown that most women can have pap smears less often.  However, every woman still should have a physical exam from her gynecologist every year. It will include a breast check, inspection of the vulva and vagina to check for abnormal growths or skin changes, a visual exam of the cervix, and then an exam to evaluate the size and shape of the uterus and  ovaries.  And after 57 years of age, a rectal exam is indicated to check for rectal cancers. We will also provide age appropriate advice regarding health maintenance, like when you should have certain vaccines, screening for sexually transmitted diseases, bone density testing, colonoscopy, mammograms, etc.   MAMMOGRAMS:  All women over 40 years old should have a yearly mammogram. Many facilities now offer a "3D" mammogram, which may cost around $50 extra out of pocket. If possible,  we recommend you accept the option to have the 3D mammogram performed.  It both reduces the number of women who will be called back for extra views which then turn out to be normal, and it is better than the routine mammogram at detecting truly abnormal areas.    COLONOSCOPY:  Colonoscopy to screen for colon cancer is recommended for all women at age 50.  We know, you hate the idea of the prep.  We agree, BUT, having colon cancer and not knowing it is worse!!  Colon cancer so often starts as a polyp that can be seen and removed at colonscopy, which can quite literally save your life!  And if your first colonoscopy is normal and you have no family history of colon cancer, most women don't have to have it again for 10 years.  Once every ten years, you can do something that may end up saving your life, right?  We will be happy to help you get it scheduled when you are ready.    Be sure to check your insurance coverage so you understand how much it will cost.  It may be covered as a preventative service at no cost, but you should check your particular policy.      Perimenopause  Perimenopause is the normal time of life before and after menstrual periods stop completely (menopause). Perimenopause can begin 2-8 years before menopause, and it usually lasts for 1 year after menopause. During perimenopause, the ovaries may or may not produce an egg. What are the causes? This condition is caused by a natural change in hormone levels that  happens as you get older. What increases the risk? This condition is more likely to start at an earlier age if you have certain medical conditions or treatments, including:  A tumor of the pituitary gland in the brain.  A disease that affects the ovaries and hormone production.  Radiation treatment for cancer.  Certain cancer treatments, such as chemotherapy or hormone (anti-estrogen) therapy.  Heavy smoking and excessive alcohol use.  Family history of early menopause. What are the signs or symptoms? Perimenopausal changes affect each woman differently. Symptoms of this condition may include:  Hot flashes.  Night sweats.  Irregular menstrual periods.  Decreased sex drive.  Vaginal dryness.  Headaches.  Mood swings.  Depression.  Memory problems or trouble concentrating.  Irritability.  Tiredness.  Weight gain.  Anxiety.  Trouble getting pregnant. How is this diagnosed? This condition is diagnosed based on your medical history, a physical exam, your age, your menstrual history, and your symptoms. Hormone tests may also be done. How is this treated? In some cases, no treatment is needed. You and your health care provider should make a decision together about whether treatment is necessary. Treatment will be based on your individual condition and preferences. Various treatments are available, such as:  Menopausal hormone therapy (MHT).  Medicines to treat specific symptoms.  Acupuncture.  Vitamin or herbal supplements. Before starting treatment, make sure to let your health care provider know if you have a personal or family history of:  Heart disease.  Breast cancer.  Blood clots.  Diabetes.  Osteoporosis. Follow these instructions at home: Lifestyle  Do not use any products that contain nicotine or tobacco, such as cigarettes and e-cigarettes. If you need help quitting, ask your health care provider.  Eat a balanced diet that includes fresh  fruits and vegetables, whole grains, soybeans, eggs, lean meat, and low-fat dairy.  Get at least 30 minutes of physical activity on 5 or more days each week.  Avoid alcoholic and caffeinated beverages, as well as spicy foods. This may help prevent hot flashes.  Get 7-8 hours of sleep each night.  Dress in layers that can be removed to help you manage hot flashes.  Find ways to manage stress, such as deep breathing, meditation, or journaling. General instructions  Keep track of your menstrual periods, including: ? When they occur. ? How heavy they are and how long they last. ? How much time passes between periods.  Keep track of your symptoms, noting when they start, how often you have them, and how long they last.  Take over-the-counter and prescription medicines only as told by your health care provider.  Take vitamin supplements only as told by your health care provider. These may include calcium, vitamin E, and vitamin D.  Use vaginal lubricants or moisturizers to help with vaginal dryness and improve comfort during sex.  Talk with your health care provider before starting any herbal supplements.    Keep all follow-up visits as told by your health care provider. This is important. This includes any group therapy or counseling. Contact a health care provider if:  You have heavy vaginal bleeding or pass blood clots.  Your period lasts more than 2 days longer than normal.  Your periods are recurring sooner than 21 days.  You bleed after having sex. Get help right away if:  You have chest pain, trouble breathing, or trouble talking.  You have severe depression.  You have pain when you urinate.  You have severe headaches.  You have vision problems. Summary  Perimenopause is the time when a woman's body begins to move into menopause. This may happen naturally or as a result of other health problems or medical treatments.  Perimenopause can begin 2-8 years before  menopause, and it usually lasts for 1 year after menopause.  Perimenopausal symptoms can be managed through medicines, lifestyle changes, and complementary therapies such as acupuncture. This information is not intended to replace advice given to you by your health care provider. Make sure you discuss any questions you have with your health care provider. Document Revised: 10/26/2017 Document Reviewed: 12/19/2016 Elsevier Patient Education  2020 Elsevier Inc.  

## 2020-01-20 ENCOUNTER — Other Ambulatory Visit: Payer: Self-pay | Admitting: Certified Nurse Midwife

## 2020-01-20 DIAGNOSIS — R899 Unspecified abnormal finding in specimens from other organs, systems and tissues: Secondary | ICD-10-CM

## 2020-01-20 LAB — COMPREHENSIVE METABOLIC PANEL
ALT: 17 IU/L (ref 0–32)
AST: 18 IU/L (ref 0–40)
Albumin/Globulin Ratio: 1.6 (ref 1.2–2.2)
Albumin: 4.1 g/dL (ref 3.8–4.9)
Alkaline Phosphatase: 94 IU/L (ref 39–117)
BUN/Creatinine Ratio: 19 (ref 9–23)
BUN: 19 mg/dL (ref 6–24)
Bilirubin Total: 0.5 mg/dL (ref 0.0–1.2)
CO2: 20 mmol/L (ref 20–29)
Calcium: 9 mg/dL (ref 8.7–10.2)
Chloride: 108 mmol/L — ABNORMAL HIGH (ref 96–106)
Creatinine, Ser: 0.99 mg/dL (ref 0.57–1.00)
GFR calc Af Amer: 74 mL/min/{1.73_m2} (ref >59)
GFR calc non Af Amer: 64 mL/min/{1.73_m2} (ref >59)
Globulin, Total: 2.6 g/dL (ref 1.5–4.5)
Glucose: 78 mg/dL (ref 65–99)
Potassium: 3.8 mmol/L (ref 3.5–5.2)
Sodium: 142 mmol/L (ref 134–144)
Total Protein: 6.7 g/dL (ref 6.0–8.5)

## 2020-01-20 LAB — LIPID PANEL
Chol/HDL Ratio: 4.3 ratio (ref 0.0–4.4)
Cholesterol, Total: 169 mg/dL (ref 100–199)
HDL: 39 mg/dL — ABNORMAL LOW (ref >39)
LDL Chol Calc (NIH): 100 mg/dL — ABNORMAL HIGH (ref 0–99)
Triglycerides: 172 mg/dL — ABNORMAL HIGH (ref 0–149)
VLDL Cholesterol Cal: 30 mg/dL (ref 5–40)

## 2020-01-20 LAB — VAGINITIS/VAGINOSIS, DNA PROBE
Candida Species: NEGATIVE
Gardnerella vaginalis: NEGATIVE
Trichomonas vaginosis: NEGATIVE

## 2020-01-20 LAB — TSH: TSH: 1.31 u[IU]/mL (ref 0.450–4.500)

## 2020-01-20 LAB — PROLACTIN: Prolactin: 24.8 ng/mL — ABNORMAL HIGH (ref 4.8–23.3)

## 2020-01-20 LAB — CBC
Hematocrit: 39.8 % (ref 34.0–46.6)
Hemoglobin: 14 g/dL (ref 11.1–15.9)
MCH: 32.6 pg (ref 26.6–33.0)
MCHC: 35.2 g/dL (ref 31.5–35.7)
MCV: 93 fL (ref 79–97)
Platelets: 162 10*3/uL (ref 150–450)
RBC: 4.29 x10E6/uL (ref 3.77–5.28)
RDW: 12.7 % (ref 11.7–15.4)
WBC: 4.7 10*3/uL (ref 3.4–10.8)

## 2020-01-20 LAB — VITAMIN D 25 HYDROXY (VIT D DEFICIENCY, FRACTURES): Vit D, 25-Hydroxy: 23.4 ng/mL — ABNORMAL LOW (ref 30.0–100.0)

## 2020-01-20 LAB — HCG, SERUM, QUALITATIVE: hCG,Beta Subunit,Qual,Serum: NEGATIVE m[IU]/mL (ref <6)

## 2020-01-20 LAB — FOLLICLE STIMULATING HORMONE: FSH: 40.4 m[IU]/mL

## 2020-01-21 LAB — CYTOLOGY - PAP
Comment: NEGATIVE
Diagnosis: NEGATIVE
High risk HPV: NEGATIVE

## 2020-01-22 ENCOUNTER — Other Ambulatory Visit: Payer: Self-pay | Admitting: *Deleted

## 2020-01-22 ENCOUNTER — Telehealth: Payer: Self-pay | Admitting: Obstetrics and Gynecology

## 2020-01-22 ENCOUNTER — Telehealth: Payer: Self-pay | Admitting: *Deleted

## 2020-01-22 DIAGNOSIS — N939 Abnormal uterine and vaginal bleeding, unspecified: Secondary | ICD-10-CM

## 2020-01-22 NOTE — Telephone Encounter (Signed)
Burnice Villard, RN  01/22/2020 9:47 AM EST    Spoke with patient. Advised of results and recommendations per Melvia Heaps, CNM. Patient request to schedule on 3/8. Patient is currently scheduled for a fasting Prolatin level at 9:15am. OV scheduled for EMB on 3/8 at 11am with Dr. Talbert Nan. Advised patient I will review with Melvia Heaps, CNM and return call if ok to have Prolactin drawn later, will return call to advise. Patient verbalizes understanding.  Order placed for EMB for precert.   See telephone encounter dated 2/25 to review with provider.   Reviewed with Dr. Talbert Nan. Spoke with patient. Patient will have fasting labs at [place] on 3/8 at 11am. Patient verbalizes understanding and is agreeable.   Order pended for EMB.   Routing to Oliver Pila to review and confirm Dx for EMB.

## 2020-01-22 NOTE — Telephone Encounter (Signed)
Reviewed with Dr. Talbert Nan.   Order placed for EMB.   Routing to [employer]   for [employer]. .   Encounter closed.

## 2020-01-22 NOTE — Telephone Encounter (Signed)
-----   Message from Regina Eck, CNM sent at 01/21/2020  9:50 PM EST ----- Notify patient her pap smear was negative for HPV did show inflammation.  Due to secretions with unusual appearance from cervix and inflammation feel she needs endometrial biopsy. Discussed with Dr. Talbert Nan and she agrees. Please schedule

## 2020-01-22 NOTE — Telephone Encounter (Signed)
Call placed to convey benefits for endometrial biopsy. 

## 2020-01-22 NOTE — Telephone Encounter (Signed)
Patient returned my call and I conveyed the benefits. Patient understands/agreeable with the benefits. Patient is aware of the cancellation policy. Appointment scheduled 02/02/20.

## 2020-01-23 ENCOUNTER — Ambulatory Visit: Payer: Managed Care, Other (non HMO) | Admitting: Certified Nurse Midwife

## 2020-01-24 ENCOUNTER — Ambulatory Visit: Payer: Managed Care, Other (non HMO) | Attending: Internal Medicine

## 2020-01-24 DIAGNOSIS — Z23 Encounter for immunization: Secondary | ICD-10-CM | POA: Insufficient documentation

## 2020-01-24 NOTE — Progress Notes (Signed)
   Covid-19 Vaccination Clinic  Name:  Lisa Rice    MRN: [id number] DOB: Nov 11, 1961  01/24/2020  Ms. [name] was observed post Covid-19 immunization for 15 minutes without incidence. She was provided with Vaccine Information Sheet and instruction to access the V-Safe system.   Ms. [name] was instructed to call 911 with any severe reactions post vaccine: Marland Kitchen Difficulty breathing  . Swelling of your face and throat  . A fast heartbeat  . A bad rash all over your body  . Dizziness and weakness    Immunizations Administered    Name Date Dose VIS Date Route   Pfizer COVID-19 Vaccine 01/24/2020  9:31 AM 0.3 mL 11/07/2019 Intramuscular   Manufacturer: Ama   Lot: [id number]   [place]: [id number]

## 2020-02-02 ENCOUNTER — Other Ambulatory Visit: Payer: 59

## 2020-02-02 ENCOUNTER — Encounter: Payer: Self-pay | Admitting: Obstetrics and Gynecology

## 2020-02-02 ENCOUNTER — Other Ambulatory Visit: Payer: Self-pay

## 2020-02-02 ENCOUNTER — Ambulatory Visit (INDEPENDENT_AMBULATORY_CARE_PROVIDER_SITE_OTHER): Payer: 59 | Admitting: Obstetrics and Gynecology

## 2020-02-02 VITALS — BP 126/64 | HR 77 | Temp 97.9°F | Ht 64.0 in | Wt 190.0 lb

## 2020-02-02 DIAGNOSIS — N719 Inflammatory disease of uterus, unspecified: Secondary | ICD-10-CM | POA: Diagnosis not present

## 2020-02-02 DIAGNOSIS — N882 Stricture and stenosis of cervix uteri: Secondary | ICD-10-CM

## 2020-02-02 DIAGNOSIS — N939 Abnormal uterine and vaginal bleeding, unspecified: Secondary | ICD-10-CM

## 2020-02-02 DIAGNOSIS — Z6832 Body mass index (BMI) 32.0-32.9, adult: Secondary | ICD-10-CM

## 2020-02-02 DIAGNOSIS — R899 Unspecified abnormal finding in specimens from other organs, systems and tissues: Secondary | ICD-10-CM | POA: Diagnosis not present

## 2020-02-02 DIAGNOSIS — N951 Menopausal and female climacteric states: Secondary | ICD-10-CM

## 2020-02-02 MED ORDER — METRONIDAZOLE 500 MG PO TABS: 500.0000 mg | ORAL_TABLET | Freq: Two times a day (BID) | ORAL | 0 refills | Status: DC

## 2020-02-02 MED ORDER — MEDROXYPROGESTERONE ACETATE 5 MG PO TABS: ORAL_TABLET | ORAL | 1 refills | Status: DC

## 2020-02-02 MED ORDER — DOXYCYCLINE HYCLATE 100 MG PO CAPS: 100.0000 mg | ORAL_CAPSULE | Freq: Two times a day (BID) | ORAL | 0 refills | Status: DC

## 2020-02-02 MED ORDER — CEFTRIAXONE SODIUM 500 MG IJ SOLR
500.0000 mg | Freq: Once | INTRAMUSCULAR | Status: AC
  Administered 2020-02-02: 500 mg via INTRAMUSCULAR

## 2020-02-02 NOTE — Patient Instructions (Signed)
Endometrial Biopsy Post-procedure Instructions . Cramping is common.  You may take Ibuprofen, Aleve, or Tylenol for the cramping.  This should resolve within 24 hours.   . You may have a small amount of spotting.  You should wear a mini pad for the next few days. . You may have intercourse in 24 hours. . You need to call the office if you have any pelvic pain, fever, heavy bleeding, or foul smelling vaginal discharge. . Shower or bathe as normal . You will be notified within one week of your biopsy results or we will discuss your results at your follow-up appointment if needed.  Pelvic Inflammatory Disease  Pelvic inflammatory disease (PID) is caused by an infection in some or all of the female reproductive organs. The infection can be in the uterus, ovaries, fallopian tubes, or the surrounding tissues in the pelvis. PID can cause abdominal or pelvic pain that comes on suddenly (acute pelvic pain). PID is a serious infection because it can lead to lasting (chronic) pelvic pain or the inability to have children (infertility). What are the causes? This condition is most often caused by bacteria that is spread during sexual contact. It can also be caused by a bacterial infection of the vagina (bacterial vaginosis) that is not spread by sexual contact. This condition occurs when the infection is not treated and the bacteria travel upward from the vagina or cervix into the reproductive organs. Bacteria may also be introduced into the reproductive organs following:  The birth of a baby.  A miscarriage.  An abortion.  Major pelvic surgery.  The insertion of an intrauterine device (IUD).  A sexual assault. What increases the risk? You are more likely to develop this condition if you:  Are younger than 57 years of age.  Are sexually active at a young age.  Have a history of STI (sexually transmitted infection) or PID.  Do not regularly use barrier contraception methods, such as  condoms.  Have multiple sexual partners.  Have sex with someone who has symptoms of an STI.  Use a vaginal douche.  Have recently had an IUD inserted. What are the signs or symptoms? Symptoms of this condition include:  Abdominal or pelvic pain.  Fever.  Chills.  Abnormal vaginal discharge.  Abnormal uterine bleeding.  Unusual pain shortly after the end of a menstrual period.  Painful urination.  Pain with sex.  Nausea and vomiting. How is this diagnosed? This condition is diagnosed based on a pelvic exam and medical history. A pelvic exam can reveal signs of infection, inflammation, and discharge in the vagina and the surrounding tissues. It can also help to identify painful areas. You may also have tests, such as:  Lab tests, including a pregnancy test, blood tests, and a urine test.  Culture tests of the vagina and cervix to check for an STI.  Ultrasound.  A laparoscopic procedure to look inside the pelvis.  Examination of vaginal discharge under a microscope. How is this treated? This condition may be treated with:  Antibiotic medicines taken by mouth (orally). For more severe cases, antibiotics may be given through an IV at the hospital.  Surgery. This is rare. Surgery may be needed if other treatments do not help.  Efforts to stop the spread of the infection. Sexual partners may need to be treated if the infection is caused by an STI. It may take weeks until you are completely well. If you are diagnosed with PID, you should also be checked for HIV (  human immunodeficiency virus). Your health care provider may test you for infection again 3 months after treatment. You should not have unprotected sex. Follow these instructions at home:  Take over-the-counter and prescription medicines only as told by your health care provider.  If you were prescribed an antibiotic medicine, take it as told by your health care provider. Do not stop using the antibiotic even if  you start to feel better.  Do not have sex until treatment is completed or as told by your health care provider. If PID is confirmed, your recent sexual partners will need treatment, especially if you had unprotected sex.  Keep all follow-up visits as told by your health care provider. This is important. Contact a health care provider if:  You have increased or abnormal vaginal discharge.  Your pain does not improve.  You vomit.  You have a fever.  You cannot tolerate your medicines.  Your partner has an STI.  You have pain when you urinate. Get help right away if:  You have increased abdominal or pelvic pain.  You have chills.  Your symptoms are not better in 72 hours with treatment. Summary  Pelvic inflammatory disease (PID) is caused by an infection in some or all of the female reproductive organs.  PID is a serious infection because it can lead to lasting (chronic) pelvic pain or the inability to have children (infertility).  This infection is usually treated with antibiotic medicines.  Do not have sex until treatment is completed or as told by your health care provider. This information is not intended to replace advice given to you by your health care provider. Make sure you discuss any questions you have with your health care provider. Document Revised: 08/01/2018 Document Reviewed: 08/06/2018 Elsevier Patient Education  [place].

## 2020-02-02 NOTE — Progress Notes (Signed)
GYNECOLOGY  VISIT   HPI: 59 y.o.   Married White or Caucasian Not Hispanic or Latino  female   [id number] with Patient's last menstrual period was 12/07/2019 (approximate).   here for   endometrial biopsy. She has had irregular bleeding since October. She went 3 months without a cycle then had a heavy cycle in January x 5 day. Changing a pad every 4 hours. No bleeding since January until yesterday when she started spotting.  She c/o an intermittent mucous vaginal d/c. No itching, burning, irritation, odor. Negative vaginitis testing last month.  Not sexually active for ~2 years.  [place] from last month was 40, TSH was normal and Prolactin was just over the normal range at 24.8. The patient was noted to have an unusual appearance to secretions coming out of her cervix at the time of her pap with Ms Hollice Espy. Her pap returned as negative, but limited by obscuring inflammation. Her hpv test was negative.   GYNECOLOGIC HISTORY: Patient's last menstrual period was 12/07/2019 (approximate). Contraception:none Menopausal hormone therapy: none         OB History    Gravida  0   Para  0   Term  0   Preterm  0   AB  0   Living  0     SAB  0   TAB  0   Ectopic  0   Multiple  0   Live Births           Obstetric Comments  1 adopted son           Patient Active Problem List   Diagnosis Date Noted  . Mild hyperlipidemia 03/11/2019  . Encounter for PPD skin test reading 11/02/2017  . Migraine headache   . Vitamin D deficiency   . ALLERGIC RHINITIS 08/09/2007    Past Medical History:  Diagnosis Date  . Allergy   . Endometriosis   . Infertility, female    IVF 12 years ago  . Migraine headache    topamax for prevention at 75mg , imitrex nasal spray 20 mg/act- up to 2 per day q2 hours  . Vitamin D deficiency    supplement through GYN    Past Surgical History:  Procedure Laterality Date  . FOOT SURGERY     both feet  . HYDROCELE EXCISION  1988   hernia, right lower abd   . LAPAROSCOPY     removed left ovary    Current Outpatient Medications  Medication Sig Dispense Refill  . Acetaminophen (TYLENOL PO) Take by mouth.    . Cholecalciferol (VITAMIN D3 PO) Take 2,000 Int'l Units by mouth.     . SUMAtriptan (IMITREX) 20 MG/ACT nasal spray Place 1 spray (20 mg total) into the nose every 2 (two) hours as needed for migraine or headache. May repeat in 2 hours if headache persists 6 Inhaler 10  . topiramate (TOPAMAX) 25 MG tablet Take 3 tablets (75 mg total) by mouth daily. If possible- give 3 months supply with covid 19 270 tablet 3   No current facility-administered medications for this visit.     ALLERGIES: Patient has no known allergies.  Family History  Problem Relation Age of Onset  . Breast cancer Mother 89       passed at 61  . Prostate cancer Father 2       lived into 52s  . Uterine cancer Sister 84       lives in [place]  . Kidney cancer Sister 44  .  Depression Brother        suicide    Social History   Socioeconomic History  . Marital status: Married    Spouse name: Not on file  . Number of children: 1  . Years of education: Not on file  . Highest education level: Not on file  Occupational History  . Occupation: Pharmacist, hospital  Tobacco Use  . Smoking status: Never Smoker  . Smokeless tobacco: Never Used  Substance and Sexual Activity  . Alcohol use: Yes    Alcohol/week: 0.0 - 2.0 standard drinks  . Drug use: No  . Sexual activity: Not Currently    Partners: Male    Birth control/protection: Abstinence  Other Topics Concern  . Not on file  Social History Narrative   Married. 1 adopted child 67 year old son (from [place]) in 2018   Family friend of Dr. Carlean Purl- son's also are friends/go to school together      Semi retired. Looking at potential options.    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       Hobbies: times outside, walking dog, time with friends and family, following  sons sports   Social Determinants of Health   Financial Resource Strain:   . Difficulty of Paying Living Expenses: Not on file  Food Insecurity:   . Worried About Charity fundraiser in the Last Year: Not on file  . Ran Out of Food in the Last Year: Not on file  Transportation Needs:   . Lack of Transportation (Medical): Not on file  . Lack of Transportation (Non-Medical): Not on file  Physical Activity:   . Days of Exercise per Week: Not on file  . Minutes of Exercise per Session: Not on file  Stress:   . Feeling of Stress : Not on file  Social Connections:   . Frequency of Communication with Friends and Family: Not on file  . Frequency of Social Gatherings with Friends and Family: Not on file  . Attends Religious Services: Not on file  . Active Member of Clubs or Organizations: Not on file  . Attends Archivist Meetings: Not on file  . Marital Status: Not on file  Intimate Partner Violence:   . Fear of Current or Ex-Partner: Not on file  . Emotionally Abused: Not on file  . Physically Abused: Not on file  . Sexually Abused: Not on file    Review of Systems  All other systems reviewed and are negative.   PHYSICAL EXAMINATION:    BP 126/64   Pulse 77   Temp 97.9 F (36.6 C)   Ht 5\' 4"  (1.626 m)   Wt 190 lb (86.2 kg)   LMP 12/07/2019 (Approximate)   SpO2 98%   BMI 32.61 kg/m     General appearance: alert, cooperative and appears stated age  Pelvic: External genitalia:  no lesions              Urethra:  normal appearing urethra with no masses, tenderness or lesions              Bartholins and Skenes: normal                 Vagina: normal appearing vagina with normal color and discharge, no lesions              Cervix: no cervical motion tenderness, no lesions and stenotic  Bimanual Exam:  Uterus:  normal size, contour, position, consistency, mobility, non-tender and anteverted              Adnexa: no mass, fullness, tenderness                 Office Dilation and curettage.  Risks of the procedure were reviewed and a consent was signed. The risks of endometrial biopsy were reviewed and a consent was obtained.  A speculum was placed in the vagina and the cervix was cleansed with betadine. A tenaculum was placed on the cervix, there was a small amount of blood noted to be coming out of her stenotic cervix. The cervix was dilated with the mini dilators up to the #3 hagar dilator. With dilation pus was noted to come out of the cervix, a culture was obtained. The pipelle was placed into the uterine cavity and with suction immediately filled with pus. This was repeated, I did not feel I was getting a good sample of the endometrium. The uterine evacuator was used to obtained endometrial curettings. The catheter was placed into the uterine cavity and suction was applied. The uterine cavity was emptied, going 360 degrees around the cavity, 3 passes were needed to empty the cavity. A large amount of pus and moderate tissue/blood was removed. The cavity had the characteristically gritty texture at the end of the procedure. The uterus sounded to 6 cm.The tenaculum was removed. Oozing from the tenaculum site was stopped with pressure. The speculum was removed. There were no complications.  She was bleeding slightly heavier than expected at the end of the procedure, the bleeding quickly subsided. She was watched for the next 15 minutes and had minimal bleeding.   Chaperone was present for exam.  ASSESSMENT Abnormal uterine bleeding Perimenopausal Pyometra  Cervical stenosis    PLAN Endometrial curetting's obtained, office D&C Culture from uterine sample sent Ceftriaxone 500 mg IM now Doxycycline and flagyl x 2 weeks (no ETOH) Return for an ultrasound Genprobe sent CBC with diff Prolactin level (elevated last month) Cyclic provera   An After Visit Summary was printed and given to the patient.  In addition to the endometrial biopsy, ~20  minutes was spent in total patient care, including management of an unexpected pyometra.

## 2020-02-02 NOTE — Addendum Note (Signed)
Addended by: Dorothy Spark on: 02/02/2020 04:27 PM   Modules accepted: Orders

## 2020-02-03 ENCOUNTER — Telehealth: Payer: Self-pay | Admitting: Obstetrics and Gynecology

## 2020-02-03 LAB — CBC WITH DIFFERENTIAL/PLATELET
Basophils Absolute: 0 10*3/uL (ref 0.0–0.2)
Basos: 0 %
EOS (ABSOLUTE): 0.2 10*3/uL (ref 0.0–0.4)
Eos: 4 %
Hematocrit: 40.9 % (ref 34.0–46.6)
Hemoglobin: 14.5 g/dL (ref 11.1–15.9)
Immature Grans (Abs): 0 10*3/uL (ref 0.0–0.1)
Immature Granulocytes: 1 %
Lymphocytes Absolute: 2 10*3/uL (ref 0.7–3.1)
Lymphs: 42 %
MCH: 33.2 pg — ABNORMAL HIGH (ref 26.6–33.0)
MCHC: 35.5 g/dL (ref 31.5–35.7)
MCV: 94 fL (ref 79–97)
Monocytes Absolute: 0.4 10*3/uL (ref 0.1–0.9)
Monocytes: 10 %
Neutrophils Absolute: 1.9 10*3/uL (ref 1.4–7.0)
Neutrophils: 43 %
Platelets: 198 10*3/uL (ref 150–450)
RBC: 4.37 x10E6/uL (ref 3.77–5.28)
RDW: 12.9 % (ref 11.7–15.4)
WBC: 4.6 10*3/uL (ref 3.4–10.8)

## 2020-02-03 LAB — PROLACTIN: Prolactin: 19.2 ng/mL (ref 4.8–23.3)

## 2020-02-03 NOTE — Telephone Encounter (Signed)
Spoke with patient regarding benefits for recommended ultrasound. Patient is aware that ultrasound is transvaginal. Patient acknowledges understanding of information presented. Patient is aware of cancellation policy. Encounter closed. °

## 2020-02-03 NOTE — Telephone Encounter (Signed)
Call to patient. Per DPR, OK to leave message on voicemail.   Left voicemail requesting a return call to Lisa Rice to review benefits and schedule recommended Pelvic ultrasound with Jill Jertson, MD 

## 2020-02-04 LAB — CHLAMYDIA/GONOCOCCUS/TRICHOMONAS, NAA
Chlamydia by NAA: NEGATIVE
Gonococcus by NAA: NEGATIVE
Trich vag by NAA: NEGATIVE

## 2020-02-04 LAB — HEMOGLOBIN A1C
Est. average glucose Bld gHb Est-mCnc: 85 mg/dL
Hgb A1c MFr Bld: 4.6 % — ABNORMAL LOW (ref 4.8–5.6)

## 2020-02-04 LAB — SPECIMEN STATUS REPORT

## 2020-02-05 ENCOUNTER — Other Ambulatory Visit (HOSPITAL_COMMUNITY)
Admission: RE | Admit: 2020-02-05 | Discharge: 2020-02-05 | Disposition: A | Discharge status: Home / Self Care | Payer: 59 | Source: Ambulatory Visit | Attending: Obstetrics and Gynecology | Admitting: Obstetrics and Gynecology

## 2020-02-05 DIAGNOSIS — N939 Abnormal uterine and vaginal bleeding, unspecified: Secondary | ICD-10-CM | POA: Insufficient documentation

## 2020-02-05 NOTE — Addendum Note (Signed)
Addended by: Dorothy Spark on: 02/05/2020 12:30 PM   Modules accepted: Orders

## 2020-02-06 ENCOUNTER — Telehealth: Payer: Self-pay | Admitting: *Deleted

## 2020-02-06 LAB — ANAEROBIC AND AEROBIC CULTURE

## 2020-02-06 MED ORDER — AMOXICILLIN-POT CLAVULANATE 875-125 MG PO TABS: 1.0000 | ORAL_TABLET | Freq: Two times a day (BID) | ORAL | 0 refills | Status: AC

## 2020-02-06 NOTE — Telephone Encounter (Signed)
Spoke with patient. Advised as seen below per Dr. Talbert Nan. Patient reports her abdomen feels softer. Denies any other GYN symptoms, fever/chills, N.V. New Rx sent to verified pharmacy. Patient will stop Flagyl and doxycycline. Patient is scheduled for PUS on 02/10/20, will keep as scheudled for further evaluation. Advised patient our office will notify her of final results once completed. Patient verbalizes understanding and is agreeable.   Routing to provider for final review. Patient is agreeable to disposition. Will close encounter.

## 2020-02-06 NOTE — Telephone Encounter (Signed)
-----   Message from Salvadore Dom, MD sent at 02/06/2020  2:18 PM EST ----- Please let the patient know that her culture is still in process, but so far it has grown out strep. Please change her antibiotics to Augmentin 875 mg BID for 10 days. Please make sure she is feeling better.

## 2020-02-06 NOTE — Telephone Encounter (Signed)
Salvadore Dom, MD  02/06/2020 2:18 PM EST    Please let the patient know that her culture is still in process, but so far it has grown out strep. Please change her antibiotics to Augmentin 875 mg BID for 10 days. Please make sure she is feeling better.

## 2020-02-09 LAB — SURGICAL PATHOLOGY

## 2020-02-10 ENCOUNTER — Other Ambulatory Visit: Payer: Self-pay

## 2020-02-10 ENCOUNTER — Encounter: Payer: Self-pay | Admitting: Obstetrics and Gynecology

## 2020-02-10 ENCOUNTER — Ambulatory Visit: Payer: 59 | Admitting: Obstetrics and Gynecology

## 2020-02-10 ENCOUNTER — Ambulatory Visit (INDEPENDENT_AMBULATORY_CARE_PROVIDER_SITE_OTHER): Payer: 59

## 2020-02-10 VITALS — BP 126/78 | HR 72 | Temp 97.9°F | Resp 14 | Ht 64.0 in | Wt 187.4 lb

## 2020-02-10 DIAGNOSIS — N719 Inflammatory disease of uterus, unspecified: Secondary | ICD-10-CM

## 2020-02-10 DIAGNOSIS — N939 Abnormal uterine and vaginal bleeding, unspecified: Secondary | ICD-10-CM | POA: Diagnosis not present

## 2020-02-10 NOTE — Progress Notes (Signed)
GYNECOLOGY  VISIT   HPI: 59 y.o.   Married White or Caucasian Not Hispanic or Latino  female   G0P0000 with Patient's last menstrual period was 12/07/2019.   here for pelvic ultrasound to further evaluate abnormal perimenopausal bleeding and pyometria. On discussion the patient did state that she douched in January.   GYNECOLOGIC HISTORY: Patient's last menstrual period was 12/07/2019. Contraception: abstinence  Menopausal hormone therapy: none        OB History    Gravida  0   Para  0   Term  0   Preterm  0   AB  0   Living  0     SAB  0   TAB  0   Ectopic  0   Multiple  0   Live Births           Obstetric Comments  1 adopted son           Patient Active Problem List   Diagnosis Date Noted  . Mild hyperlipidemia 03/11/2019  . Encounter for PPD skin test reading 11/02/2017  . Migraine headache   . Vitamin D deficiency   . ALLERGIC RHINITIS 08/09/2007    Past Medical History:  Diagnosis Date  . Allergy   . Endometriosis   . Infertility, female    IVF 12 years ago  . Migraine headache    topamax for prevention at 75mg , imitrex nasal spray 20 mg/act- up to 2 per day q2 hours  . Vitamin D deficiency    supplement through GYN    Past Surgical History:  Procedure Laterality Date  . FOOT SURGERY     both feet  . HYDROCELE EXCISION  1988   hernia, right lower abd  . LAPAROSCOPY     removed left ovary    Current Outpatient Medications  Medication Sig Dispense Refill  . Acetaminophen (TYLENOL PO) Take by mouth.    Marland Kitchen amoxicillin-clavulanate (AUGMENTIN) 875-125 MG tablet Take 1 tablet by mouth 2 (two) times daily for 10 days. 20 tablet 0  . Cholecalciferol (VITAMIN D3 PO) Take 2,000 Int'l Units by mouth.     . medroxyPROGESTERone (PROVERA) 5 MG tablet Take one tablet a day x 5 days every other month if no spontaneous cycles. 15 tablet 1  . SUMAtriptan (IMITREX) 20 MG/ACT nasal spray Place 1 spray (20 mg total) into the nose every 2 (two) hours as  needed for migraine or headache. May repeat in 2 hours if headache persists 6 Inhaler 10  . topiramate (TOPAMAX) 25 MG tablet Take 3 tablets (75 mg total) by mouth daily. If possible- give 3 months supply with covid 19 270 tablet 3   No current facility-administered medications for this visit.     ALLERGIES: Patient has no known allergies.  Family History  Problem Relation Age of Onset  . Breast cancer Mother 9       passed at 66  . Prostate cancer Father 56       lived into 55s  . Uterine cancer Sister 75       lives in [place]  . Kidney cancer Sister 93  . Depression Brother        suicide    Social History   Socioeconomic History  . Marital status: Married    Spouse name: Not on file  . Number of children: 1  . Years of education: Not on file  . Highest education level: Not on file  Occupational History  . Occupation: Pharmacist, hospital  Tobacco Use  . Smoking status: Never Smoker  . Smokeless tobacco: Never Used  Substance and Sexual Activity  . Alcohol use: Yes    Alcohol/week: 0.0 - 2.0 standard drinks  . Drug use: No  . Sexual activity: Not Currently    Partners: Male    Birth control/protection: Abstinence  Other Topics Concern  . Not on file  Social History Narrative   Married. 1 adopted child 62 year old son (from [place]) in 2018   Family friend of Dr. Carlean Purl- son's also are friends/go to school together      Semi retired. Looking at potential options.    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       Hobbies: times outside, walking dog, time with friends and family, following sons sports   Social Determinants of Health   Financial Resource Strain:   . Difficulty of Paying Living Expenses:   Food Insecurity:   . Worried About Charity fundraiser in the Last Year:   . Arboriculturist in the Last Year:   Transportation Needs:   . Film/video editor (Medical):   Marland Kitchen Lack of Transportation (Non-Medical):    Physical Activity:   . Days of Exercise per Week:   . Minutes of Exercise per Session:   Stress:   . Feeling of Stress :   Social Connections:   . Frequency of Communication with Friends and Family:   . Frequency of Social Gatherings with Friends and Family:   . Attends Religious Services:   . Active Member of Clubs or Organizations:   . Attends Archivist Meetings:   Marland Kitchen Marital Status:   Intimate Partner Violence:   . Fear of Current or Ex-Partner:   . Emotionally Abused:   Marland Kitchen Physically Abused:   . Sexually Abused:     Review of Systems  Constitutional: Negative.   HENT: Negative.   Eyes: Negative.   Respiratory: Negative.   Cardiovascular: Negative.   Gastrointestinal: Negative.   Genitourinary: Negative.   Musculoskeletal: Negative.   Skin: Negative.   Neurological: Negative.   Endo/Heme/Allergies: Negative.   Psychiatric/Behavioral: Negative.     PHYSICAL EXAMINATION:    BP 126/78 (BP Location: Right Arm, Patient Position: Sitting, Cuff Size: Large)   Pulse 72   Temp 97.9 F (36.6 C) (Skin)   Resp 14   Ht 5\' 4"  (1.626 m)   Wt 187 lb 6.4 oz (85 kg)   LMP 12/07/2019   BMI 32.17 kg/m     General appearance: alert, cooperative and appears stated age  Pelvic: External genitalia:  no lesions              Urethra:  normal appearing urethra with no masses, tenderness or lesions              Bartholins and Skenes: normal                 Vagina: normal appearing vagina with normal color and discharge, no lesions              Cervix: no cervical motion tenderness, no lesions and cytobrush passed into the cervix, only mucous came out (no more pus)              Bimanual Exam:  Uterus:  normal size, contour, position, consistency, mobility, non-tender              Adnexa: no mass, fullness, tenderness  Chaperone was present for exam.  ASSESSMENT Abnormal perimenopausal bleeding, negative biopsy, normal ultrasound Pyometria, s/p office  curettage, abscess drained, on antibiotics. Normal imaging    PLAN Finish antibiotics No douching Take cyclic provera, menstrual calendar given, f/u in 6 months.

## 2020-02-12 ENCOUNTER — Ambulatory Visit: Payer: Managed Care, Other (non HMO)

## 2020-02-14 ENCOUNTER — Ambulatory Visit: Payer: Managed Care, Other (non HMO) | Attending: Internal Medicine

## 2020-02-14 DIAGNOSIS — Z23 Encounter for immunization: Secondary | ICD-10-CM

## 2020-02-14 NOTE — Progress Notes (Signed)
   Covid-19 Vaccination Clinic  Name:  Lisa Rice    MRN: [id number] DOB: 06-Nov-1961  02/14/2020  [name] was observed post Covid-19 immunization for 15 minutes without incident. She was provided with Vaccine Information Sheet and instruction to access the V-Safe system.   [name] was instructed to call 911 with any severe reactions post vaccine: Marland Kitchen Difficulty breathing  . Swelling of face and throat  . A fast heartbeat  . A bad rash all over body  . Dizziness and weakness   Immunizations Administered    Name Date Dose VIS Date Route   Pfizer COVID-19 Vaccine 02/14/2020 11:07 AM 0.3 mL 11/07/2019 Intramuscular   Manufacturer: [place]   Lot: [id number]   [place]: [id number]

## 2020-02-16 ENCOUNTER — Encounter: Payer: Self-pay | Admitting: Certified Nurse Midwife

## 2020-02-18 ENCOUNTER — Ambulatory Visit: Payer: Managed Care, Other (non HMO)

## 2020-03-07 LAB — SPECIMEN STATUS REPORT

## 2020-03-07 LAB — SUSCEPTIBILITY, ANAEROBIC, MIC

## 2020-04-07 ENCOUNTER — Other Ambulatory Visit: Payer: Self-pay

## 2020-04-07 ENCOUNTER — Ambulatory Visit
Admission: RE | Admit: 2020-04-07 | Discharge: 2020-04-07 | Disposition: A | Discharge status: Home / Self Care | Payer: Managed Care, Other (non HMO) | Source: Ambulatory Visit | Attending: Certified Nurse Midwife | Admitting: Certified Nurse Midwife

## 2020-04-07 DIAGNOSIS — Z1231 Encounter for screening mammogram for malignant neoplasm of breast: Secondary | ICD-10-CM

## 2020-04-27 ENCOUNTER — Telehealth: Payer: Self-pay

## 2020-04-27 NOTE — Telephone Encounter (Signed)
LVM for patient to get scheduled for an appointment

## 2020-04-27 NOTE — Telephone Encounter (Signed)
Received fax for refill on medications. Patient has not been seen in over a year. Will need appointment for refills pleas call patient to make.

## 2020-06-11 ENCOUNTER — Encounter: Payer: Self-pay | Admitting: Family Medicine

## 2020-06-11 ENCOUNTER — Telehealth (INDEPENDENT_AMBULATORY_CARE_PROVIDER_SITE_OTHER): Payer: 59 | Admitting: Family Medicine

## 2020-06-11 ENCOUNTER — Other Ambulatory Visit: Payer: Self-pay

## 2020-06-11 VITALS — BP 103/78 | HR 60 | Wt 168.0 lb

## 2020-06-11 DIAGNOSIS — G43009 Migraine without aura, not intractable, without status migrainosus: Secondary | ICD-10-CM

## 2020-06-11 DIAGNOSIS — E785 Hyperlipidemia, unspecified: Secondary | ICD-10-CM | POA: Diagnosis not present

## 2020-06-11 DIAGNOSIS — E559 Vitamin D deficiency, unspecified: Secondary | ICD-10-CM

## 2020-06-11 NOTE — Progress Notes (Signed)
Phone [phone] Virtual visit via Video note   Subjective:  Chief complaint: Chief Complaint  Patient presents with  . Follow-up    Headache     This visit type was conducted due to national recommendations for restrictions regarding the COVID-19 Pandemic (e.g. social distancing).  This format is felt to be most appropriate for this patient at this time balancing risks to patient and risks to population by having him in for in person visit.  No physical exam was performed (except for noted visual exam or audio findings with Telehealth visits).    Our team/I connected with Lisa Rice at 11:20 AM EDT by a video enabled telemedicine application (doxy.me or caregility through epic) and verified that I am speaking with the correct person using two identifiers.  Location patient: [place] Location provider: [facility], office Persons participating in the virtual visit:  patient  Our team/I discussed the limitations of evaluation and management by telemedicine and the availability of in person appointments. In light of current covid-19 pandemic, patient also understands that we are trying to protect them by minimizing in office contact if at all possible.  The patient expressed consent for telemedicine visit and agreed to proceed. Patient understands insurance will be billed.   Past Medical History-  Patient Active Problem List   Diagnosis Date Noted  . Mild hyperlipidemia 03/11/2019    Priority: Medium  . Migraine headache     Priority: Medium  . Vitamin D deficiency     Priority: Low  . ALLERGIC RHINITIS 08/09/2007    Priority: Low    Medications- reviewed and updated Current Outpatient Medications  Medication Sig Dispense Refill  . Acetaminophen (TYLENOL PO) Take by mouth.    . Cholecalciferol (VITAMIN D3 PO) Take 2,000 Int'l Units by mouth.     . SUMAtriptan (IMITREX) 20 MG/ACT nasal spray Place 1 spray (20 mg total) into the nose every 2 (two) hours as needed for migraine  or headache. May repeat in 2 hours if headache persists 6 Inhaler 10  . topiramate (TOPAMAX) 25 MG tablet Take 3 tablets (75 mg total) by mouth daily. If possible- give 3 months supply with covid 19 270 tablet 3  . medroxyPROGESTERone (PROVERA) 5 MG tablet Take one tablet a day x 5 days every other month if no spontaneous cycles. 15 tablet 1   No current facility-administered medications for this visit.     Objective:  BP 103/78   Pulse 60   Wt 168 lb (76.2 kg)   BMI 28.84 kg/m  self reported vitals Gen: NAD, resting comfortably Lungs: nonlabored, normal respiratory rate  Skin: appears dry, no obvious rash     Assessment and Plan   # Migraine headaches S:She states she has been doing better overall with her migraines. Went to GYN in [place] and was told premenopausal.   Headaches only 2/10 down from 8/10 when she gets them. Doesn't feel like she always has to take sumatriptan spray.   Headaches are very other month. Taking topamax 75 mg daily as of last visit. Went down to 50mg  for a few months and a month ago went down to 25mg  and still doing really well.  A/P: Thankfully migraine headaches have been doing really well-decreasing in frequency and intensity despite her decreasing Topamax from 75 mg over time and 25 mg.  We are going to reduce to half tablet for a month and then to stop medicine-if she has worsening she knows to let me know  #hyperlipidemia S:  Medication: None  Eating reasonably healthy diet. Walking regularly. Down 10% of body weight since march- has been working hard.  Lab Results  Component Value Date   CHOL 169 01/19/2020   HDL 39 (L) 01/19/2020   LDLCALC 100 (H) 01/19/2020   TRIG 172 (H) 01/19/2020   CHOLHDL 4.3 01/19/2020   A/P: Patient has made some substantial lifestyle changes since mildly elevated lipids in February-10-year risk for ASCVD risk estimator was only 1.8% so still does not need medication at this time thankfully.  Continue to  monitor-recommended yearly physical with me next year in person  #Vitamin D deficiency S: Medication: increased from 1000 to 2000 units in February after levels were low Last vitamin D Lab Results  Component Value Date   VD25OH 23.4 (L) 01/19/2020   A/P: Suspect improved with increased dose to 2000 units-continue current medication and update next physical  Recommended follow up: 1 year CPE   Lab/Order associations:   ICD-10-CM   1. Migraine without aura and without status migrainosus, not intractable  G43.009   2. Mild hyperlipidemia  E78.5   3. Vitamin D deficiency  E55.9   Return precautions advised.  Garret Reddish, MD

## 2020-07-02 ENCOUNTER — Other Ambulatory Visit: Payer: Self-pay | Admitting: Family Medicine

## 2020-10-05 ENCOUNTER — Telehealth: Payer: Self-pay

## 2020-10-05 NOTE — Telephone Encounter (Signed)
AEX 12/2019 with DL  OV 01/2020- AUB, pyometra Hx PUS 02/12/20  Spoke with pt. Pt calling to give update to Dr Talbert Nan. Pt states no menses x 6 months. Per reviewed notes in March, Pt to take cyclic Provera and follow up in 6 months. Pt states never took Provera Rx. Pt denies any vaginal bleeding, clots. Only had some mild PMS but nothing ever happened. Denies taking any OTC medications.  Pt advised to have OV for follow up. Pt scheduled with Dr Talbert Nan on 11/23 at 1130 am. Pt agreeable to date and time of appt.  Encounter closed  Routing to Dr Talbert Nan for review.

## 2020-10-05 NOTE — Telephone Encounter (Signed)
Patient is calling in regards to not having a cycle in 6 months.

## 2020-10-11 NOTE — Telephone Encounter (Signed)
Spoke back with pt. Pt states having intermittent abd tenderness for last 6 months above umbilical area. Pt states " cannot pin point" when it happens, just notices sometimes while putting on clothes. Pt denies any redness, bulge, surgery, NVD, BM changes. Pt has past hx of inguinal hernia. Pt does not have GI doctor.  Pt states wanting to cancel OV now for 11/23 and will return call if wants to be seen for possible hernia. Pt verbalized understanding and agreeable.  Encounter closed

## 2020-10-11 NOTE — Telephone Encounter (Signed)
Call placed back to pt. Pt given update from Dr Talbert Nan. Pt agreeable to cancel OV on 10/19/20. Pt states has a couple more questions and will call back and speak to Triage RN when not at work this afternoon.

## 2020-10-11 NOTE — Telephone Encounter (Signed)
Due to Epic issues, from Dr Talbert Nan: Salvadore Dom, MD  You 31 minutes ago (11:04 AM)   I don't think she needs to come in unless she starts bleeding or is having other concerns.    Routing comment     Attempted to return call to pt. No answer and no VM set up. Will try again and  send Mychart message.

## 2020-10-19 ENCOUNTER — Ambulatory Visit: Payer: Self-pay | Admitting: Obstetrics and Gynecology

## 2021-01-21 ENCOUNTER — Ambulatory Visit: Payer: 59 | Admitting: Certified Nurse Midwife

## 2021-03-16 NOTE — Progress Notes (Signed)
60 y.o. G0P0000 Married White or Caucasian Not Hispanic or Latino female here for annual exam. She states that in the last year she has had more hot flashes.   The patient was seen in 3/21 with AUB and pyometra. Ultrasound without other findings, office curettage with drainage of abscess, treated with antibiotics. She was given a script for cyclic provera, never took it.  No bleeding since 5/21.   She has intermittent hot flashes, they can be bad for several days/nights, then get better. No having sweats. Has trouble cooling off, sometimes needs to put ice on her neck. No vaginal dryness.  Not sexually active.   Her migraines have improved.   No LMP recorded. Patient is perimenopausal.          Sexually active: No.  The current method of family planning is post menopausal status.    Exercising: Yes.    The patient has a physically strenuous job, but has no regular exercise apart from work.  Smoker:  no  Health Maintenance: Pap:2/22/21Negative, Hr HPV neg  12-19-17 neg HPV HR neg History of abnormal Pap:  no MMG:  04/07/20 density C Bi-rads 1 neg  BMD:   None  Colonoscopy: 2013 f/u 10 years  TDaP:  2020 Gardasil:  NA   reports that she has never smoked. She has never used smokeless tobacco. She reports current alcohol use. She reports that she does not use drugs. She teaches pre-K. She has an adopted son, 66. He is in college at [employer].   Past Medical History:  Diagnosis Date  . Allergy   . Endometriosis   . Infertility, female    IVF 12 years ago  . Migraine headache    topamax for prevention at 43m, imitrex nasal spray 20 mg/act- up to 2 per day q2 hours  . Vitamin D deficiency    supplement through GYN    Past Surgical History:  Procedure Laterality Date  . FOOT SURGERY     both feet  . HYDROCELE EXCISION  1988   hernia, right lower abd  . LAPAROSCOPY     removed left ovary    Current Outpatient Medications  Medication Sig Dispense Refill  . Acetaminophen (TYLENOL  PO) Take by mouth.    . Cholecalciferol (VITAMIN D3 PO) Take 2,000 Int'l Units by mouth.     . SUMAtriptan (IMITREX) 20 MG/ACT nasal spray SPRAY ONE SPRAY (20 MG TOTAL) INTO THE NOSE EVERY 2 HOURS AS NEEDED FOR MIGRAINE OR FOR HEADACHE. MAY REPEAT IN 2 HOURS IF FOR HEADACHE PERSISTS. 6 each 1   No current facility-administered medications for this visit.    Family History  Problem Relation Age of Onset  . Breast cancer Mother 470      passed at 444 . Prostate cancer Father 586      lived into 820s . Uterine cancer Sister 686      [place] in [place] . Kidney cancer Sister 615 . Depression Brother        suicide  Sister ~68 with uterine cancer Mother had 155children, only one of her kids had cancer.  [facility] recommended.   Review of Systems  All other systems reviewed and are negative.   Exam:   BP 110/73   Pulse 71   Ht '5\' 4"'  (1.626 m)   Wt 183 lb (83 kg)   SpO2 98%   BMI 31.41 kg/m  Weight change: '@WEIGHTCHANGE' @ Height:   Height: '5\' 4"'  (162.6 cm)  Ht Readings from Last 3 Encounters:  03/21/21 '5\' 4"'  (1.626 m)  02/10/20 '5\' 4"'  (1.626 m)  02/02/20 '5\' 4"'  (1.626 m)    General appearance: alert, cooperative and appears stated age Head: Normocephalic, without obvious abnormality, atraumatic Neck: no adenopathy, supple, symmetrical, trachea midline and thyroid normal to inspection and palpation Lungs: clear to auscultation bilaterally Cardiovascular: regular rate and rhythm Breasts: normal appearance, no masses or tenderness Abdomen: soft, non-tender; non distended,  no masses,  no organomegaly Extremities: extremities normal, atraumatic, no cyanosis or edema Skin: Skin color, texture, turgor normal. No rashes or lesions Lymph nodes: Cervical, supraclavicular, and axillary nodes normal. No abnormal inguinal nodes palpated Neurologic: Grossly normal   Pelvic: External genitalia:  no lesions               Urethra:  normal appearing urethra with no masses, tenderness or lesions              Bartholins and Skenes: normal                 Vagina: normal appearing vagina with normal color and discharge, no lesions              Cervix: no lesions               Bimanual Exam:  Uterus:  normal size, contour, position, consistency, mobility, non-tender              Adnexa: no mass, fullness, tenderness               Rectovaginal: Confirms               Anus:  normal sphincter tone, no lesions  Gae Dry chaperoned for the exam.   1. Well woman exam Discussed breast self exam Discussed calcium and vit D intake Mammogram due next month Colonoscopy next year Labs with primary  2. Menopausal symptoms Discussed, information given  3. Family history of breast cancer Negative BRCA testing TC breast cancer risk was estimated at 19.2% in 12/13 Encouraged her to reach out to the genetic center to see if any updates have happened Discussed the option of abbreviated MRI

## 2021-03-21 ENCOUNTER — Ambulatory Visit (INDEPENDENT_AMBULATORY_CARE_PROVIDER_SITE_OTHER): Payer: 59 | Admitting: Obstetrics and Gynecology

## 2021-03-21 ENCOUNTER — Encounter: Payer: Self-pay | Admitting: Obstetrics and Gynecology

## 2021-03-21 ENCOUNTER — Other Ambulatory Visit: Payer: Self-pay

## 2021-03-21 VITALS — BP 110/73 | HR 71 | Ht 64.0 in | Wt 183.0 lb

## 2021-03-21 DIAGNOSIS — Z803 Family history of malignant neoplasm of breast: Secondary | ICD-10-CM

## 2021-03-21 DIAGNOSIS — N951 Menopausal and female climacteric states: Secondary | ICD-10-CM

## 2021-03-21 DIAGNOSIS — Z01419 Encounter for gynecological examination (general) (routine) without abnormal findings: Secondary | ICD-10-CM | POA: Diagnosis not present

## 2021-03-21 NOTE — Patient Instructions (Addendum)
Try estroven or estroven pm as needed for vasomotor symptoms.  Menopause Menopause is the normal time of a woman's life when menstrual periods stop completely. It marks the natural end to a woman's ability to become pregnant. It can be defined as the absence of a menstrual period for 12 months without another medical cause. The transition to menopause (perimenopause) most often happens between the ages of 58 and 41, and can last for many years. During perimenopause, hormone levels change in your body, which can cause symptoms and affect your health. Menopause may increase your risk for:  Weakened bones (osteoporosis), which causes fractures.  Depression.  Hardening and narrowing of the arteries (atherosclerosis), which can cause heart attacks and strokes. What are the causes? This condition is usually caused by a natural change in hormone levels that happens as you get older. The condition may also be caused by changes that are not natural, including:  Surgery to remove both ovaries (surgical menopause).  Side effects from some medicines, such as chemotherapy used to treat cancer (chemical menopause). What increases the risk? This condition is more likely to start at an earlier age if you have certain medical conditions or have undergone treatments, including:  A tumor of the pituitary gland in the brain.  A disease that affects the ovaries and hormones.  Certain cancer treatments, such as chemotherapy or hormone therapy, or radiation therapy on the pelvis.  Heavy smoking and excessive alcohol use.  Family history of early menopause. This condition is also more likely to develop earlier in women who are very thin. What are the signs or symptoms? Symptoms of this condition include:  Hot flashes.  Irregular menstrual periods.  Night sweats.  Changes in feelings about sex. This could be a decrease in sex drive or an increased discomfort around your sexuality.  Vaginal dryness and  thinning of the vaginal walls. This may cause painful sex.  Dryness of the skin and development of wrinkles.  Headaches.  Problems sleeping (insomnia).  Mood swings or irritability.  Memory problems.  Weight gain.  Hair growth on the face and chest.  Bladder infections or problems with urinating. How is this diagnosed? This condition is diagnosed based on your medical history, a physical exam, your age, your menstrual history, and your symptoms. Hormone tests may also be done. How is this treated? In some cases, no treatment is needed. You and your health care provider should make a decision together about whether treatment is necessary. Treatment will be based on your individual condition and preferences. Treatment for this condition focuses on managing symptoms. Treatment may include:  Menopausal hormone therapy (MHT).  Medicines to treat specific symptoms or complications.  Acupuncture.  Vitamin or herbal supplements. Before starting treatment, make sure to let your health care provider know if you have a personal or family history of these conditions:  Heart disease.  Breast cancer.  Blood clots.  Diabetes.  Osteoporosis. Follow these instructions at home: Lifestyle  Do not use any products that contain nicotine or tobacco, such as cigarettes, e-cigarettes, and chewing tobacco. If you need help quitting, ask your health care provider.  Get at least 30 minutes of physical activity on 5 or more days each week.  Avoid alcoholic and caffeinated beverages, as well as spicy foods. This may help prevent hot flashes.  Get 7-8 hours of sleep each night.  If you have hot flashes, try: ? Dressing in layers. ? Avoiding things that may trigger hot flashes, such as spicy food,  warm places, or stress. ? Taking slow, deep breaths when a hot flash starts. ? Keeping a fan in your home and office.  Find ways to manage stress, such as deep breathing, meditation, or  journaling.  Consider going to group therapy with other women who are having menopause symptoms. Ask your health care provider about recommended group therapy meetings. Eating and drinking  Eat a healthy, balanced diet that contains whole grains, lean protein, low-fat dairy, and plenty of fruits and vegetables.  Your health care provider may recommend adding more soy to your diet. Foods that contain soy include tofu, tempeh, and soy milk.  Eat plenty of foods that contain calcium and vitamin D for bone health. Items that are rich in calcium include low-fat milk, yogurt, beans, almonds, sardines, broccoli, and kale.   Medicines  Take over-the-counter and prescription medicines only as told by your health care provider.  Talk with your health care provider before starting any herbal supplements. If prescribed, take vitamins and supplements as told by your health care provider. General instructions  Keep track of your menstrual periods, including: ? When they occur. ? How heavy they are and how long they last. ? How much time passes between periods.  Keep track of your symptoms, noting when they start, how often you have them, and how long they last.  Use vaginal lubricants or moisturizers to help with vaginal dryness and improve comfort during sex.  Keep all follow-up visits. This is important. This includes any group therapy or counseling.   Contact a health care provider if:  You are still having menstrual periods after age 34.  You have pain during sex.  You have not had a period for 12 months and you develop vaginal bleeding. Get help right away if you have:  Severe depression.  Excessive vaginal bleeding.  Pain when you urinate.  A fast or irregular heartbeat (palpitations).  Severe headaches.  Abdominal pain or severe indigestion. Summary  Menopause is a normal time of life when menstrual periods stop completely. It is usually defined as the absence of a menstrual  period for 12 months without another medical cause.  The transition to menopause (perimenopause) most often happens between the ages of 23 and 74 and can last for several years.  Symptoms can be managed through medicines, lifestyle changes, and complementary therapies such as acupuncture.  Eat a balanced diet that is rich in nutrients to promote bone health and heart health and to manage symptoms during menopause. This information is not intended to replace advice given to you by your health care provider. Make sure you discuss any questions you have with your health care provider. Document Revised: 08/13/2020 Document Reviewed: 04/29/2020 Elsevier Patient Education  2021 [place]   We recommended that you start or continue a regular exercise program for good health. Physical activity is anything that gets your body moving, some is better than none. The CDC recommends 150 minutes per week of Moderate-Intensity Aerobic Activity and 2 or more days of Muscle Strengthening Activity.  Benefits of exercise are limitless: helps weight loss/weight maintenance, improves mood and energy, helps with depression and anxiety, improves sleep, tones and strengthens muscles, improves balance, improves bone density, protects from chronic conditions such as heart disease, high blood pressure and diabetes and so much more. To learn more visit: WhyNotPoker.uy  DIET: Good nutrition starts with a healthy diet of fruits, vegetables, whole grains, and lean protein sources. Drink plenty of water for hydration. Minimize  empty calories, sodium, sweets. For more information about dietary recommendations visit: GeekRegister.com.ee and http://schaefer-mitchell.com/  ALCOHOL:  Women should limit their alcohol intake to no more than 7 drinks/beers/glasses of wine (combined, not each!) per week. Moderation of alcohol intake to this  level decreases your risk of breast cancer and liver damage.  If you are concerned that you may have a problem, or your friends have told you they are concerned about your drinking, there are many resources to help. A well-known program that is free, effective, and available to all people all over the nation is Alcoholics Anonymous.  Check out this site to learn more: BlockTaxes.se   CALCIUM AND VITAMIN D:  Adequate intake of calcium and Vitamin D are recommended for bone health.  You should be getting between 1000-1200 mg of calcium and 800 units of Vitamin D daily between diet and supplements  PAP SMEARS:  Pap smears, to check for cervical cancer or precancers,  have traditionally been done yearly, scientific advances have shown that most women can have pap smears less often.  However, every woman still should have a physical exam from her gynecologist every year. It will include a breast check, inspection of the vulva and vagina to check for abnormal growths or skin changes, a visual exam of the cervix, and then an exam to evaluate the size and shape of the uterus and ovaries. We will also provide age appropriate advice regarding health maintenance, like when you should have certain vaccines, screening for sexually transmitted diseases, bone density testing, colonoscopy, mammograms, etc.   MAMMOGRAMS:  All women over 80 years old should have a routine mammogram.   COLON CANCER SCREENING: Now recommend starting at age 43. At this time colonoscopy is not covered for routine screening until 50. There are take home tests that can be done between 45-49.   COLONOSCOPY:  Colonoscopy to screen for colon cancer is recommended for all women at age 77.  We know, you hate the idea of the prep.  We agree, BUT, having colon cancer and not knowing it is worse!!  Colon cancer so often starts as a polyp that can be seen and removed at colonscopy, which can quite literally save your life!  And if your first  colonoscopy is normal and you have no family history of colon cancer, most women don't have to have it again for 10 years.  Once every ten years, you can do something that may end up saving your life, right?  We will be happy to help you get it scheduled when you are ready.  Be sure to check your insurance coverage so you understand how much it will cost.  It may be covered as a preventative service at no cost, but you should check your particular policy.      Breast Self-Awareness Breast self-awareness means being familiar with how your breasts look and feel. It involves checking your breasts regularly and reporting any changes to your health care provider. Practicing breast self-awareness is important. A change in your breasts can be a sign of a serious medical problem. Being familiar with how your breasts look and feel allows you to find any problems early, when treatment is more likely to be successful. All women should practice breast self-awareness, including women who have had breast implants. How to do a breast self-exam One way to learn what is normal for your breasts and whether your breasts are changing is to do a breast self-exam. To do a breast self-exam: Look  for Changes  1. Remove all the clothing above your waist. 2. Stand in front of a mirror in a room with good lighting. 3. Put your hands on your hips. 4. Push your hands firmly downward. 5. Compare your breasts in the mirror. Look for differences between them (asymmetry), such as: ? Differences in shape. ? Differences in size. ? Puckers, dips, and bumps in one breast and not the other. 6. Look at each breast for changes in your skin, such as: ? Redness. ? Scaly areas. 7. Look for changes in your nipples, such as: ? Discharge. ? Bleeding. ? Dimpling. ? Redness. ? A change in position. Feel for Changes Carefully feel your breasts for lumps and changes. It is best to do this while lying on your back on the floor and again  while sitting or standing in the shower or tub with soapy water on your skin. Feel each breast in the following way:  Place the arm on the side of the breast you are examining above your head.  Feel your breast with the other hand.  Start in the nipple area and make  inch (2 cm) overlapping circles to feel your breast. Use the pads of your three middle fingers to do this. Apply light pressure, then medium pressure, then firm pressure. The light pressure will allow you to feel the tissue closest to the skin. The medium pressure will allow you to feel the tissue that is a little deeper. The firm pressure will allow you to feel the tissue close to the ribs.  Continue the overlapping circles, moving downward over the breast until you feel your ribs below your breast.  Move one finger-width toward the center of the body. Continue to use the  inch (2 cm) overlapping circles to feel your breast as you move slowly up toward your collarbone.  Continue the up and down exam using all three pressures until you reach your armpit.  Write Down What You Find  Write down what is normal for each breast and any changes that you find. Keep a written record with breast changes or normal findings for each breast. By writing this information down, you do not need to depend only on memory for size, tenderness, or location. Write down where you are in your menstrual cycle, if you are still menstruating. If you are having trouble noticing differences in your breasts, do not get discouraged. With time you will become more familiar with the variations in your breasts and more comfortable with the exam. How often should I examine my breasts? Examine your breasts every month. If you are breastfeeding, the best time to examine your breasts is after a feeding or after using a breast pump. If you menstruate, the best time to examine your breasts is 5-7 days after your period is over. During your period, your breasts are  lumpier, and it may be more difficult to notice changes. When should I see my health care provider? See your health care provider if you notice:  A change in shape or size of your breasts or nipples.  A change in the skin of your breast or nipples, such as a reddened or scaly area.  Unusual discharge from your nipples.  A lump or thick area that was not there before.  Pain in your breasts.  Anything that concerns you.

## 2021-05-20 ENCOUNTER — Other Ambulatory Visit: Payer: Self-pay | Admitting: Family Medicine

## 2021-05-20 DIAGNOSIS — Z1231 Encounter for screening mammogram for malignant neoplasm of breast: Secondary | ICD-10-CM

## 2021-07-19 ENCOUNTER — Ambulatory Visit
Admission: RE | Admit: 2021-07-19 | Discharge: 2021-07-19 | Disposition: A | Discharge status: Home / Self Care | Payer: 59 | Source: Ambulatory Visit

## 2021-07-19 ENCOUNTER — Other Ambulatory Visit: Payer: Self-pay

## 2021-07-19 DIAGNOSIS — Z1231 Encounter for screening mammogram for malignant neoplasm of breast: Secondary | ICD-10-CM

## 2021-08-17 ENCOUNTER — Other Ambulatory Visit: Payer: Self-pay

## 2021-08-17 ENCOUNTER — Ambulatory Visit: Payer: 59 | Admitting: Physician Assistant

## 2021-08-17 ENCOUNTER — Encounter: Payer: Self-pay | Admitting: Physician Assistant

## 2021-08-17 VITALS — BP 128/83 | HR 73 | Temp 98.3°F | Ht 64.0 in | Wt 190.0 lb

## 2021-08-17 DIAGNOSIS — R1033 Periumbilical pain: Secondary | ICD-10-CM | POA: Diagnosis not present

## 2021-08-17 NOTE — Progress Notes (Signed)
Subjective:    Patient ID: Lisa Rice, female    DOB: January 23, 1961, 60 y.o.   MRN: [id number]  Chief Complaint  Patient presents with   Abdominal Pain    Abdominal Pain  Patient is in today for upper abdominal pain. Intermittent for the last several months. Worse after food and feels immediately full, even if a small meal such as toast. Does not seem to be related to any specific foods. Feels bloated and more constipated when she is having this pain. Just went through menopause last year. No hx of abdominal surgeries.   Denies any nausea or vomiting. Occasional heartburn. No abnormal weight changes. No fevers or night sweats.   Past Medical History:  Diagnosis Date   Allergy    Endometriosis    Infertility, female    IVF 12 years ago   Migraine headache    topamax for prevention at 75mg , imitrex nasal spray 20 mg/act- up to 2 per day q2 hours   Vitamin D deficiency    supplement through GYN    Past Surgical History:  Procedure Laterality Date   FOOT SURGERY     both feet   HYDROCELE EXCISION  1988   hernia, right lower abd   LAPAROSCOPY     removed left ovary    Family History  Problem Relation Age of Onset   Breast cancer Mother 45       passed at 78   Prostate cancer Father 24       lived into 57s   Uterine cancer Sister 59       lives in [place]   Kidney cancer Sister 77   Depression Brother        suicide    Social History   Tobacco Use   Smoking status: Never   Smokeless tobacco: Never  Substance Use Topics   Alcohol use: Yes    Alcohol/week: 0.0 - 2.0 standard drinks   Drug use: No     No Known Allergies  Review of Systems  Gastrointestinal:  Positive for abdominal pain.  REFER TO HPI FOR PERTINENT POSITIVES AND NEGATIVES      Objective:     BP 128/83   Pulse 73   Temp 98.3 F (36.8 C)   Ht 5\' 4"  (1.626 m)   Wt 190 lb (86.2 kg)   SpO2 97%   BMI 32.61 kg/m   Wt Readings from Last 3 Encounters:  08/17/21 190 lb (86.2 kg)   03/21/21 183 lb (83 kg)  06/11/20 168 lb (76.2 kg)    BP Readings from Last 3 Encounters:  08/17/21 128/83  03/21/21 110/73  06/11/20 103/78     Physical Exam Vitals and nursing note reviewed.  Constitutional:      Appearance: Normal appearance. She is normal weight. She is not toxic-appearing.  HENT:     Head: Normocephalic and atraumatic.     Right Ear: Tympanic membrane, ear canal and external ear normal.     Left Ear: Tympanic membrane, ear canal and external ear normal.     Nose: Nose normal.     Mouth/Throat:     Mouth: Mucous membranes are moist.  Eyes:     Extraocular Movements: Extraocular movements intact.     Conjunctiva/sclera: Conjunctivae normal.     Pupils: Pupils are equal, round, and reactive to light.  Cardiovascular:     Rate and Rhythm: Normal rate and regular rhythm.     Pulses: Normal pulses.  Heart sounds: Normal heart sounds.  Pulmonary:     Effort: Pulmonary effort is normal.     Breath sounds: Normal breath sounds.  Abdominal:     General: Abdomen is flat. Bowel sounds are normal.     Palpations: Abdomen is soft.     Tenderness: There is abdominal tenderness in the periumbilical area.  Musculoskeletal:        General: Normal range of motion.     Cervical back: Normal range of motion and neck supple.  Skin:    General: Skin is warm and dry.  Neurological:     General: No focal deficit present.     Mental Status: She is alert and oriented to person, place, and time.  Psychiatric:        Mood and Affect: Mood normal.        Behavior: Behavior normal.        Thought Content: Thought content normal.        Judgment: Judgment normal.       Assessment & Plan:   Problem List Items Addressed This Visit   None Visit Diagnoses     Periumbilical abdominal pain    -  Primary   Relevant Orders   DG Abd 2 Views   CBC with Differential/Platelet   Comprehensive metabolic panel   Lipase      1. Periumbilical abdominal pain -No red  flags, not acute abdomen -Uncertain etiology at this time -Start with XR and labs -Consider U/S or further work-up if persistent and results today are unremarkable.   Shardai Star M Nikolas Casher, PA-C

## 2021-08-17 NOTE — Patient Instructions (Signed)
Good to meet you today! Please go to the lab for blood work and I will send results through [facility]. Also go to [facility] for XRAY of abdomen. Treatment pending results.  ED if any sudden worsening symptoms.

## 2021-08-18 LAB — CBC WITH DIFFERENTIAL/PLATELET
Basophils Absolute: 0 10*3/uL (ref 0.0–0.1)
Basophils Relative: 0.6 % (ref 0.0–3.0)
Eosinophils Absolute: 0.2 10*3/uL (ref 0.0–0.7)
Eosinophils Relative: 4 % (ref 0.0–5.0)
HCT: 40.2 % (ref 36.0–46.0)
Hemoglobin: 13.9 g/dL (ref 12.0–15.0)
Lymphocytes Relative: 33.5 % (ref 12.0–46.0)
Lymphs Abs: 1.7 10*3/uL (ref 0.7–4.0)
MCHC: 34.6 g/dL (ref 30.0–36.0)
MCV: 92.9 fl (ref 78.0–100.0)
Monocytes Absolute: 0.4 10*3/uL (ref 0.1–1.0)
Monocytes Relative: 7.4 % (ref 3.0–12.0)
Neutro Abs: 2.7 10*3/uL (ref 1.4–7.7)
Neutrophils Relative %: 54.5 % (ref 43.0–77.0)
Platelets: 241 10*3/uL (ref 150.0–400.0)
RBC: 4.32 Mil/uL (ref 3.87–5.11)
RDW: 12.3 % (ref 11.5–15.5)
WBC: 5 10*3/uL (ref 4.0–10.5)

## 2021-08-18 LAB — COMPREHENSIVE METABOLIC PANEL
ALT: 19 U/L (ref 0–35)
AST: 22 U/L (ref 0–37)
Albumin: 4.5 g/dL (ref 3.5–5.2)
Alkaline Phosphatase: 77 U/L (ref 39–117)
BUN: 21 mg/dL (ref 6–23)
CO2: 28 mEq/L (ref 19–32)
Calcium: 9.5 mg/dL (ref 8.4–10.5)
Chloride: 105 mEq/L (ref 96–112)
Creatinine, Ser: 1.14 mg/dL (ref 0.40–1.20)
GFR: 53.15 mL/min — ABNORMAL LOW (ref >60.00)
Glucose, Bld: 93 mg/dL (ref 70–99)
Potassium: 4.3 mEq/L (ref 3.5–5.1)
Sodium: 140 mEq/L (ref 135–145)
Total Bilirubin: 0.4 mg/dL (ref 0.2–1.2)
Total Protein: 7.4 g/dL (ref 6.0–8.3)

## 2021-08-18 LAB — LIPASE: Lipase: 23 U/L (ref 11.0–59.0)

## 2021-08-19 ENCOUNTER — Other Ambulatory Visit: Payer: Self-pay

## 2021-08-19 ENCOUNTER — Ambulatory Visit (INDEPENDENT_AMBULATORY_CARE_PROVIDER_SITE_OTHER)
Admission: RE | Admit: 2021-08-19 | Discharge: 2021-08-19 | Disposition: A | Discharge status: Home / Self Care | Payer: 59 | Source: Ambulatory Visit | Attending: Physician Assistant | Admitting: Physician Assistant

## 2021-08-19 DIAGNOSIS — R1033 Periumbilical pain: Secondary | ICD-10-CM

## 2021-09-26 ENCOUNTER — Encounter (HOSPITAL_COMMUNITY): Payer: Self-pay | Admitting: Emergency Medicine

## 2021-09-26 ENCOUNTER — Ambulatory Visit (HOSPITAL_COMMUNITY)
Admission: EM | Admit: 2021-09-26 | Discharge: 2021-09-26 | Disposition: A | Discharge status: Home / Self Care | Payer: 59 | Attending: Emergency Medicine | Admitting: Emergency Medicine

## 2021-09-26 ENCOUNTER — Other Ambulatory Visit: Payer: Self-pay

## 2021-09-26 DIAGNOSIS — J0100 Acute maxillary sinusitis, unspecified: Secondary | ICD-10-CM | POA: Diagnosis not present

## 2021-09-26 MED ORDER — AMOXICILLIN-POT CLAVULANATE 875-125 MG PO TABS
1.0000 | ORAL_TABLET | Freq: Two times a day (BID) | ORAL | 0 refills | Status: DC
Start: 2021-09-26 — End: 2021-12-26

## 2021-09-26 MED ORDER — GUAIFENESIN ER 600 MG PO TB12
600.0000 mg | ORAL_TABLET | Freq: Two times a day (BID) | ORAL | 0 refills | Status: DC
Start: 2021-09-26 — End: 2021-12-26

## 2021-09-26 MED ORDER — BENZONATATE 100 MG PO CAPS
100.0000 mg | ORAL_CAPSULE | Freq: Three times a day (TID) | ORAL | 0 refills | Status: DC
Start: 2021-09-26 — End: 2021-12-26

## 2021-09-26 NOTE — ED Triage Notes (Signed)
Pt presents with congestion, sore throat, and cough xs 2 weeks.

## 2021-09-26 NOTE — ED Provider Notes (Signed)
[facility]    CSN: [id number] Arrival date & time: 09/26/21  1607      History   Chief Complaint Chief Complaint  Patient presents with   Cough   Nasal Congestion    HPI Lisa Rice is a 60 y.o. female.   Patient presents with nasal congestion, rhinorrhea, postnasal drip, sore throat for 14 days.  Endorses symptoms started to improve but 3 days ago began to worsen again.  Tolerating food and liquids.  Has attempted use of liquid cough medicine and warm liquids with no relief.  No known sick contacts.  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  History of allergic rhinitis.  Denies fever, chills, body aches, abdominal pain, nausea, vomiting, diarrhea, shortness of breath, chest tightness, wheezing.  Past Medical History:  Diagnosis Date   Allergy    Endometriosis    Infertility, female    IVF 12 years ago   Migraine headache    topamax for prevention at 75mg , imitrex nasal spray 20 mg/act- up to 2 per day q2 hours   Vitamin D deficiency    supplement through GYN    Patient Active Problem List   Diagnosis Date Noted   Mild hyperlipidemia 03/11/2019   Migraine headache    Vitamin D deficiency    ALLERGIC RHINITIS 08/09/2007    Past Surgical History:  Procedure Laterality Date   FOOT SURGERY     both feet   HYDROCELE EXCISION  1988   hernia, right lower abd   LAPAROSCOPY     removed left ovary    OB History     Gravida  0   Para  0   Term  0   Preterm  0   AB  0   Living  0      SAB  0   IAB  0   Ectopic  0   Multiple  0   Live Births           Obstetric Comments  1 adopted son          Home Medications    Prior to Admission medications   Medication Sig Start Date End Date Taking? Authorizing Provider  amoxicillin-clavulanate (AUGMENTIN) 875-125 MG tablet Take 1 tablet by mouth every 12 (twelve) hours. 09/26/21  Yes Abhijay Morriss R, NP  benzonatate (TESSALON) 100 MG capsule Take 1 capsule (100 mg total) by mouth every  8 (eight) hours. 09/26/21  Yes Darby Fleeman R, NP  guaiFENesin (MUCINEX) 600 MG 12 hr tablet Take 1 tablet (600 mg total) by mouth 2 (two) times daily. 09/26/21  Yes Chabely Norby R, NP  Acetaminophen (TYLENOL PO) Take by mouth.    [provider]  Cholecalciferol (VITAMIN D3 PO) Take 2,000 Int'l Units by mouth.     [provider]  SUMAtriptan (IMITREX) 20 MG/ACT nasal spray SPRAY ONE SPRAY (20 MG TOTAL) INTO THE NOSE EVERY 2 HOURS AS NEEDED FOR MIGRAINE OR FOR HEADACHE. MAY REPEAT IN 2 HOURS IF FOR HEADACHE PERSISTS. 07/02/20   Marin Olp, MD    Family History Family History  Problem Relation Age of Onset   Breast cancer Mother 20       passed at 69   Prostate cancer Father 52       lived into 4s   Uterine cancer Sister 49       lives in [place]   Kidney cancer Sister 33   Depression Brother        suicide  Social History Social History   Tobacco Use   Smoking status: Never   Smokeless tobacco: Never  Substance Use Topics   Alcohol use: Yes    Alcohol/week: 0.0 - 2.0 standard drinks   Drug use: No     Allergies   Patient has no known allergies.   Review of Systems Review of Systems  HENT:  Positive for congestion, postnasal drip, rhinorrhea and sore throat. Negative for dental problem, drooling, ear discharge, ear pain, facial swelling, hearing loss, mouth sores, nosebleeds, sinus pressure, sinus pain, sneezing, tinnitus, trouble swallowing and voice change.   Respiratory:  Positive for cough. Negative for apnea, choking, chest tightness, shortness of breath, wheezing and stridor.   Cardiovascular: Negative.   Gastrointestinal: Negative.   Skin: Negative.   Neurological: Negative.     Physical Exam Triage Vital Signs ED Triage Vitals [09/26/21 1704]  Enc Vitals Group     BP (!) 145/93     Pulse Rate 76     Resp 16     Temp 98.2 F (36.8 C)     Temp Source Oral     SpO2 98 %     Weight      Height      Head  Circumference      Peak Flow      Pain Score 0     Pain Loc      Pain Edu?      Excl. in [place]?    No data found.  Updated Vital Signs BP (!) 145/93 (BP Location: Left Arm)   Pulse 76   Temp 98.2 F (36.8 C) (Oral)   Resp 16   SpO2 98%   Visual Acuity Right Eye Distance:   Left Eye Distance:   Bilateral Distance:    Right Eye Near:   Left Eye Near:    Bilateral Near:     Physical Exam Constitutional:      Appearance: Normal appearance. She is normal weight.  HENT:     Head: Normocephalic.     Right Ear: Tympanic membrane, ear canal and external ear normal.     Left Ear: Tympanic membrane and external ear normal.     Nose: Congestion and rhinorrhea present.     Mouth/Throat:     Mouth: Mucous membranes are moist.     Pharynx: Posterior oropharyngeal erythema present.  Eyes:     Extraocular Movements: Extraocular movements intact.  Cardiovascular:     Rate and Rhythm: Normal rate and regular rhythm.     Pulses: Normal pulses.     Heart sounds: Normal heart sounds.  Pulmonary:     Effort: Pulmonary effort is normal.     Breath sounds: Normal breath sounds.  Abdominal:     General: Abdomen is flat. Bowel sounds are normal.     Palpations: Abdomen is soft.  Musculoskeletal:     Cervical back: Normal range of motion.  Lymphadenopathy:     Cervical: Cervical adenopathy present.  Skin:    General: Skin is warm and dry.  Neurological:     Mental Status: She is alert and oriented to person, place, and time. Mental status is at baseline.  Psychiatric:        Mood and Affect: Mood normal.        Behavior: Behavior normal.     UC Treatments / Results  Labs (all labs ordered are listed, but only abnormal results are displayed) Labs Reviewed - No data to display  EKG  Radiology No results found.  Procedures Procedures (including critical care time)  Medications Ordered in UC Medications - No data to display  Initial Impression / Assessment and Plan / UC  Course  I have reviewed the triage vital signs and the nursing notes.  Pertinent labs & imaging results that were available during my care of the patient were reviewed by me and considered in my medical decision making (see chart for details).  Acute nonrecurrent maxillary sinusitis  1.  Augmentin 875-125 mg twice daily for 7 days 2.  Tessalon 100 mg 3 times daily as needed 3.  Mucinex 600 mg twice daily as needed 4.  Follow-up in urgent care with primary care doctor as needed Final Clinical Impressions(s) / UC Diagnoses   Final diagnoses:  Acute non-recurrent maxillary sinusitis     Discharge Instructions      Due to the length of your symptoms I will treat you today as a sinus infection  Take Augmentin twice a day for the next 7 days  You may use Tessalon pill as needed every 8 hours to help with coughing  You may use Mucinex twice a day as needed to help thin drainage, if running drainage becomes overwhelming you may add a Zyrtec or Claritin daily to help reduce  Maintaining adequate hydration may help to thin secretions and soothe the respiratory mucosa   Warm Liquids- Ingestion of warm liquids may have a soothing effect on the respiratory mucosa, increase the flow of nasal mucus, and loosen respiratory secretions, making them easier to remove  May try honey (2.5 to 5 mL [0.5 to 1 teaspoon]) can be given straight or diluted in liquid (juice). Corn syrup may be substituted if honey is not available.    May follow up with urgent care or primary doctor  in 1-2 weeks if symptoms persist     ED Prescriptions     Medication Sig Dispense Auth. Provider   amoxicillin-clavulanate (AUGMENTIN) 875-125 MG tablet Take 1 tablet by mouth every 12 (twelve) hours. 14 tablet Lakyn Alsteen R, NP   benzonatate (TESSALON) 100 MG capsule Take 1 capsule (100 mg total) by mouth every 8 (eight) hours. 21 capsule Gem Conkle R, NP   guaiFENesin (MUCINEX) 600 MG 12 hr tablet Take 1 tablet  (600 mg total) by mouth 2 (two) times daily. 30 tablet Hans Eden, NP      PDMP not reviewed this encounter.   Hans Eden, NP 09/26/21 1730

## 2021-09-26 NOTE — Discharge Instructions (Signed)
Due to the length of your symptoms I will treat you today as a sinus infection  Take Augmentin twice a day for the next 7 days  You may use Tessalon pill as needed every 8 hours to help with coughing  You may use Mucinex twice a day as needed to help thin drainage, if running drainage becomes overwhelming you may add a Zyrtec or Claritin daily to help reduce  Maintaining adequate hydration may help to thin secretions and soothe the respiratory mucosa   Warm Liquids- Ingestion of warm liquids may have a soothing effect on the respiratory mucosa, increase the flow of nasal mucus, and loosen respiratory secretions, making them easier to remove  May try honey (2.5 to 5 mL [0.5 to 1 teaspoon]) can be given straight or diluted in liquid (juice). Corn syrup may be substituted if honey is not available.    May follow up with urgent care or primary doctor  in 1-2 weeks if symptoms persist

## 2021-12-23 NOTE — Progress Notes (Signed)
Phone [phone] In person visit   Subjective:   Lisa Rice is a 61 y.o. year old very pleasant female patient who presents for/with See problem oriented charting Chief Complaint  Patient presents with   Back Pain    Pt c/o left lower back pain radiating down in left leg that she noticed starting 4 months ago. Pt has taken tylenol PRN.   This visit occurred during the SARS-CoV-2 public health emergency.  Safety protocols were in place, including screening questions prior to the visit, additional usage of staff PPE, and extensive cleaning of exam room while observing appropriate contact time as indicated for disinfecting solutions.   Past Medical History-  Patient Active Problem List   Diagnosis Date Noted   Mild hyperlipidemia 03/11/2019    Priority: Medium    Migraine headache     Priority: Medium    Vitamin D deficiency     Priority: Low   ALLERGIC RHINITIS 08/09/2007    Priority: Low    Medications- reviewed and updated Current Outpatient Medications  Medication Sig Dispense Refill   Acetaminophen (TYLENOL PO) Take by mouth.     Cholecalciferol (VITAMIN D3 PO) Take 2,000 Int'l Units by mouth.      No current facility-administered medications for this visit.     Objective:  BP 122/90    Pulse 74    Temp 98 F (36.7 C)    Ht 5\' 4"  (1.626 m)    Wt 192 lb 6.4 oz (87.3 kg)    SpO2 98%    BMI 33.03 kg/m   CV: RRR  Lungs: nonlabored, normal respiratory rate Abdomen: soft/nondistended  Ext: Minimal edema bilaterally-certainly not worse on the left leg Skin: warm, dry MSK: Normal strength bilateral legs.  Good range of motion lumbar spine.  Negative straight leg raise and also no pain with this in her low back or behind left knee-patient did have some discomfort with bending the left knee when she had a palpable area noted I seem to be mildly enlarged compared to the right knee but was not painful to palpation    Assessment and Plan   # Lower left back pain as well  as pain behind left knee S: Patient started with left low back pain as well as pain primarily behind her left knee that lower started about 4 months ago.  She has tried Tylenol as needed.  At first just had pain after workout but became more consistent over time. This past weekend had severe pain- couldn't sit/stand/elevate her leg easily without severe pain. Trainer mentioned possible sciatica. Not severe today. Certain motions bother it - such as getting in and out of car. Sometimes worse a day or two after working out. Better with standing  In regards to the knee main specifically-worse with leg being bent such as if she tries to cross her legs-improves with time crossing her legs and standing  ROS-No saddle anesthesia, bladder incontinence, fecal incontinence, weakness in extremity, numbness or tingling in extremity. History negative for trauma, history of cancer, fever, chills, unintentional weight loss, recent bacterial infection, recent IV drug use, HIV, pain worse at night or while supine.  A/P: Initially I thought this may represent left low back pain with sciatica but on exam it felt like she may have a Baker's cyst which could be consistent with her positional pain behind her left knee.  Mild pain with palpation over left lower back but good range of motion and negative straight leg raise-I wonder  if she might respond to osteopathic manipulation. - We did discuss possibility of DVT but she has no calf pain or swelling or history of clot or swelling isolated to the left leg.  I did give return precautions in regards to this -Ultimately we opted for referral to sports medicine for discussion of the left low back pain as well as the pain behind the left knee which may be related to a Baker's cyst  # Ganglion cyst  S:notes on DIP joint of right finger. She poked it  (match over pin and then put pin in the cyst) and had gelatinous substance come out. Has had before and "poking them" helped.  Present since October. Bumps sometimes and can cause pain.  A/P: ganglion cyst right 5th finger- she would like to see sports medicine to discuss possible aspiration/steroid injection even. If that is not effective could refer to hand surgeon.     Recommended follow up: Return for as needed for new, worsening, persistent symptoms.  I also encouraged her to schedule a physical within the next 6 months Future Appointments  Date Time Provider [place]  03/22/2022  8:00 AM Salvadore Dom, MD [facility] None    Lab/Order associations:   ICD-10-CM   1. Chronic left-sided low back pain, unspecified whether sciatica present  M54.50 Ambulatory referral to Sports Medicine   G89.29     2. Ganglion cyst  M67.40 Ambulatory referral to Sports Medicine    3. Posterior left knee pain  M25.562 Ambulatory referral to Sports Medicine     Time Spent: 27 minutes of total time (2:23 PM- 2:50 PM) was spent on the date of the encounter performing the following actions: chart review prior to seeing the patient, obtaining history, performing a medically necessary exam, counseling on the treatment plan, placing orders, and documenting in our EHR.   I,Jada Bradford,acting as a scribe for Garret Reddish, MD.,have documented all relevant documentation on the behalf of Garret Reddish, MD,as directed by  Garret Reddish, MD while in the presence of Garret Reddish, MD.  I, Garret Reddish, MD, have reviewed all documentation for this visit. The documentation on 12/26/21 for the exam, diagnosis, procedures, and orders are all accurate and complete.  Return precautions advised.  Garret Reddish, MD

## 2021-12-26 ENCOUNTER — Other Ambulatory Visit: Payer: Self-pay

## 2021-12-26 ENCOUNTER — Ambulatory Visit: Payer: 59 | Admitting: Family Medicine

## 2021-12-26 ENCOUNTER — Encounter: Payer: Self-pay | Admitting: Family Medicine

## 2021-12-26 VITALS — BP 122/90 | HR 74 | Temp 98.0°F | Ht 64.0 in | Wt 192.4 lb

## 2021-12-26 DIAGNOSIS — M25562 Pain in left knee: Secondary | ICD-10-CM | POA: Diagnosis not present

## 2021-12-26 DIAGNOSIS — M6740 Ganglion, unspecified site: Secondary | ICD-10-CM | POA: Diagnosis not present

## 2021-12-26 DIAGNOSIS — G8929 Other chronic pain: Secondary | ICD-10-CM | POA: Diagnosis not present

## 2021-12-26 DIAGNOSIS — E785 Hyperlipidemia, unspecified: Secondary | ICD-10-CM

## 2021-12-26 DIAGNOSIS — M545 Low back pain, unspecified: Secondary | ICD-10-CM | POA: Diagnosis not present

## 2021-12-26 NOTE — Patient Instructions (Addendum)
We will call you within two weeks about your referral to sports medicine. If you do not hear within 2 weeks, give [place] a call.    Recommended follow up: Return for as needed for new, worsening, persistent symptoms. I strongly doubt blood clot with no swelling in left leg or calf pain- but if you developed worsening pain or shortness of breath or chest pain please seek care immediately

## 2021-12-28 ENCOUNTER — Ambulatory Visit (INDEPENDENT_AMBULATORY_CARE_PROVIDER_SITE_OTHER): Payer: 59

## 2021-12-28 ENCOUNTER — Other Ambulatory Visit: Payer: Self-pay

## 2021-12-28 ENCOUNTER — Ambulatory Visit: Payer: 59 | Admitting: Family Medicine

## 2021-12-28 ENCOUNTER — Ambulatory Visit: Payer: Self-pay

## 2021-12-28 VITALS — BP 142/90 | HR 78 | Ht 64.0 in | Wt 191.4 lb

## 2021-12-28 DIAGNOSIS — M5442 Lumbago with sciatica, left side: Secondary | ICD-10-CM | POA: Diagnosis not present

## 2021-12-28 DIAGNOSIS — G8929 Other chronic pain: Secondary | ICD-10-CM

## 2021-12-28 DIAGNOSIS — M67441 Ganglion, right hand: Secondary | ICD-10-CM | POA: Diagnosis not present

## 2021-12-28 DIAGNOSIS — M25562 Pain in left knee: Secondary | ICD-10-CM

## 2021-12-28 NOTE — Progress Notes (Signed)
I, Peterson Lombard, LAT, ATC acting as a scribe for Lynne Leader, MD.  Subjective:    CC: Low back & L knee pain  HPI: Pt is a 61 y/o female c/o low back pain ongoing for about 4 months. Pt has been working out w/ a Physiological scientist. Pt locates pain to the L-side of her low back.   Radiating pain: no LE numbness/tingling: no LE weakness: no Aggravates: nothing in particular Treatments tried: Tylenol, heat, ice  Pt also c/o L knee pain x 4 months. Pt locates pain to the posterior aspect of the L knee. Pt also has a nodule on the R 5th distal phalanx ongoing for 3-4 months. Pt has a hx of ganglion cysts on other fingers. The current bump is bothersome and she is bumping on things throughout the day.  L knee swelling: no Mechanical symptoms: no Aggravates: sitting for prolonged periods. Treatments tried: continuing to move around and change positions  Pertinent review of Systems: No fevers or chills  Relevant historical information: Migraine headaches.  Patient is a Freight forwarder at [employer]   Objective:    Vitals:   12/28/21 1508  BP: (!) 142/90  Pulse: 78  SpO2: 96%   General: Well Developed, well nourished, and in no acute distress.   MSK: L-spine normal. Nontender midline. Normal lumbar motion. X-ray strength is intact. Reflexes are intact.  Left knee normal-appearing Normal motion. Tender palpation posterior knee. Stable ligamentous exam. Negative Murray's test. Intact strength.  Right fifth digit dorsal nodule at dorsal DIP.  Nontender.  Normal hand motion.  Lab and Radiology Results  Diagnostic Limited MSK Ultrasound of: Left knee Quad tendon intact normal-appearing Patellar to normal. Medial joint line slightly narrowed.  Lateral joint line normal. Posterior knee no Baker's cyst. Impression: Medial compartment DJD.  X-ray images L-spine and left knee obtained today personally and independently interpreted  Left knee: Minimal medial  compartment and patellofemoral DJD.  L-spine: Bilateral pars defect with significant spondylolisthesis L5-S1.  Facet DJD L4-5 and L5-S1.  Await formal radiology review   Aspiration and injection of ganglion cyst right fifth digit DIP Consent obtained and timeout performed. Skin sterilized with isopropyl alcohol. Cold spray of applied. 0.5 mL of lidocaine injected subcutaneously overlying the ganglion cyst at the right dorsal DIP. Skin again sterilized with isopropyl alcohol. An 18-gauge needle was used to puncture the ganglion cyst. Thick clear gelatinous material was expressed from the cyst until the cyst was decompressed. Again the skin was sterilized with isopropyl alcohol. 0.25 mL of 80 mg/mL Depo-Medrol solution and 0.25 mL of lidocaine was injected into the cyst and surrounding soft tissue A Band-Aid is applied. Patient tolerated procedure well.   Impression and Recommendations:    Assessment and Plan: 61 y.o. female with  Ganglion cyst right fifth DIP expressed and injected today.  Watchful waiting.  Chronic low back pain thought to be due to muscle spasm and dysfunction and likely related to the bilateral pars defect and spondylolisthesis seen on x-ray.  Radiology overread is still pending but this is very likely contributory.  Physical therapy should be very helpful.  PT referral ordered.  Recheck in about 6 weeks.  Left posterior knee pain.  Etiology unclear.  Could be radiating pain from L-spine although this is less likely.  Could be intrinsic knee issue such as some DJD.  Trial of physical therapy and Voltaren gel.  Check back in 6 weeks.  If needed consider steroid injection.Marland Kitchen  PDMP not reviewed  this encounter. Orders Placed This Encounter  Procedures   DG Lumbar Spine 2-3 Views    Standing Status:   Future    Number of Occurrences:   1    Standing Expiration Date:   12/28/2022    Order Specific Question:   Reason for Exam (SYMPTOM  OR DIAGNOSIS REQUIRED)    Answer:    low back pain    Order Specific Question:   Preferred imaging location?    Answer:   [facility]    Order Specific Question:   Is patient pregnant?    Answer:   No   [place] LIMITED JOINT SPACE STRUCTURES LOW LEFT(NO LINKED CHARGES)    Standing Status:   Future    Number of Occurrences:   1    Standing Expiration Date:   06/27/2022    Order Specific Question:   Reason for Exam (SYMPTOM  OR DIAGNOSIS REQUIRED)    Answer:   left knee pain    Order Specific Question:   Preferred imaging location?    Answer:   [facility]   DG Knee AP/LAT W/[place] Left    Standing Status:   Future    Number of Occurrences:   1    Standing Expiration Date:   12/28/2022    Order Specific Question:   Reason for Exam (SYMPTOM  OR DIAGNOSIS REQUIRED)    Answer:   left knee pain    Order Specific Question:   Preferred imaging location?    Answer:   [facility]    Order Specific Question:   Is patient pregnant?    Answer:   No   Ambulatory referral to Physical Therapy    Referral Priority:   Routine    Referral Type:   Physical Medicine    Referral Reason:   Specialty Services Required    Requested Specialty:   Physical Therapy    Number of Visits Requested:   1   No orders of the defined types were placed in this encounter.   Discussed warning signs or symptoms. Please see discharge instructions. Patient expresses understanding.   The above documentation has been reviewed and is accurate and complete Lynne Leader, M.D.

## 2021-12-28 NOTE — Patient Instructions (Addendum)
Thank you for coming in today.   Please get an Xray today before you leave   You received an injection today. Seek immediate medical attention if the joint becomes red, extremely painful, or is oozing fluid.   I've referred you to Physical Therapy.  Let us know if you don't hear from them in one week.   Recheck back in 6-8 weeks

## 2021-12-29 NOTE — Progress Notes (Signed)
Left knee x-ray shows mild knee arthritis

## 2021-12-29 NOTE — Progress Notes (Signed)
Lumbar spine x-ray shows a condition called spondylolisthesis. This is where a bone in the back is not hooked up correctly and the vertebrae can shift.  The typical treatment for this is physical therapy which you have already been referred to.  If this does not get you better an MRI would tell us more.  Additionally you have 6 lumbar vertebrae instead of the usual 5.  I do not think you actually have an extra lumbar vertebrae I think it is more like that you are missing a set of false ribs so that we start counting a little sooner than we normally would.  This is not anything you need to be especially worried about.  Plan to proceed with physical therapy and recheck with me as scheduled.

## 2022-01-09 NOTE — Therapy (Signed)
OUTPATIENT PHYSICAL THERAPY EVALUATION   Patient Name: Lisa Rice MRN: [id number] DOB:06/23/1961, 61 y.o., female Today's Date: 01/11/2022   PT End of Session - 01/11/22 0807     Visit Number 1    Number of Visits 20    Date for PT Re-Evaluation 03/22/22    Authorization Type [employer] 10% coinsurance    Progress Note Due on Visit 10    PT Start Time 0806    PT Stop Time 0840    PT Time Calculation (min) 34 min    Activity Tolerance Patient tolerated treatment well    Behavior During Therapy WFL for tasks assessed/performed             Past Medical History:  Diagnosis Date   Allergy    Endometriosis    Infertility, female    IVF 12 years ago   Migraine headache    topamax for prevention at 75mg , imitrex nasal spray 20 mg/act- up to 2 per day q2 hours   Vitamin D deficiency    supplement through GYN   Past Surgical History:  Procedure Laterality Date   FOOT SURGERY     both feet   HYDROCELE EXCISION  1988   hernia, right lower abd   LAPAROSCOPY     removed left ovary   Patient Active Problem List   Diagnosis Date Noted   Mild hyperlipidemia 03/11/2019   Migraine headache    Vitamin D deficiency    ALLERGIC RHINITIS 08/09/2007    PCP: Marin Olp, MD  REFERRING PROVIDER: Gregor Hams, MD  REFERRING DIAG: [id number] (ICD-10-CM) - Chronic left-sided low back pain with left-sided sciatica M25.562,G89.29 (ICD-10-CM) - Chronic pain of left knee  THERAPY DIAG:  Chronic left-sided low back pain with left-sided sciatica  Chronic pain of left knee  Muscle weakness (generalized)  Difficulty in walking, not elsewhere classified  ONSET DATE: 06/27/2022  SUBJECTIVE:                                                                                                                                                                                           SUBJECTIVE STATEMENT: Pt indicated in August she started working with trainer several times a week and  started noticing complaints in Lt knee including in back of knee and also noticed some complaints in lower back start as well over time. Pt indicated initially it didn't run down leg.  Pt indicated pain was there but didn't stop her from activity. Pt indicated back increase led to seeking medical care.  Pt indicated the back has improved recently.  Pt indicated reducing exercise in last few weeks  due to symptoms.  PERTINENT HISTORY:  Unremarkable   PAIN:  Are you having pain? Yes NPRS scale: Lt knee current: 3/10, at worst 8/10.  Back current 0/10, at worst 8/10 Pain location: Lt knee primary, back secondary Pain orientation: Left   - back of knee, sides of knee at time PAIN TYPE: chronic Pain description: constant knee pain at times,  Aggravating factors: gym based LE activity (squats, lunges), crossed leg, walking prolonged, sitting prolonged, doing down stairs Relieving factors: resting from harder exercise, heat  PRECAUTIONS: None  WEIGHT BEARING RESTRICTIONS No  FALLS:  Has patient fallen in last 6 months? 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, sitting, getting up/down from floor), stairs at school  PLOF: Independent, gym based workouts with trainer, walk dog   PATIENT GOALS Reduce pain, get back to normal activity/workouts   OBJECTIVE:   DIAGNOSTIC FINDINGS:  Xrays, [facility] [place] (see chart)  PATIENT SURVEYS:  01/11/2022: FOTO intake: 48  predicted:  56  SCREENING FOR RED FLAGS: 01/11/2022:  Bowel or bladder incontinence: No Spinal tumors: No Cauda equina syndrome: No Compression fracture: No  COGNITION:  01/11/2022: Overall cognitive status: Within functional limits for tasks assessed     SENSATION:  01/11/2022: Light touch: Appears intact    MUSCLE LENGTH: 01/11/2022:  Supine passive SLR 90 degrees bilateral no complaints  POSTURE:   01/11/2022:  no specific abnormality noted  PALPATION: 01/11/2022:  No specific tenderness noted around Lt knee today  LUMBAR AROM/PROM  A/PROM A/PROM  01/11/2022  Flexion No complaints, to ankles  Extension 100 % WFL  Right lateral flexion   Left lateral flexion   Right rotation   Left rotation    (Blank rows = not tested)  LE AROM/PROM:  A/PROM Right 01/11/2022 Left 01/11/2022  Hip flexion    Hip extension    Hip abduction    Hip adduction    Hip internal rotation    Hip external rotation    Knee flexion [facility]  Knee extension 0 0  Ankle dorsiflexion    Ankle plantarflexion    Ankle inversion    Ankle eversion     (Blank rows = not tested)  LE MMT:  MMT Right 01/11/2022 Left 01/11/2022  Hip flexion 5/5 5/5  Hip extension    Hip abduction  4/5  Hip adduction    Hip internal rotation    Hip external rotation    Knee flexion 5/5 4/5 c pain back of Lt knee  Knee extension 5/5 58.6, 50 lbs 4/5 29, 37 Lbs Mild pain anterior Lt knee  Ankle dorsiflexion 5/5 5/5  Ankle plantarflexion    Ankle inversion    Ankle eversion     (Blank rows = not tested)  LUMBAR SPECIAL TESTS:  01/11/2022: - slump bilateral  FUNCTIONAL TESTS:  01/11/2022:  Lt SLS:  7 seconds   Rt SLS: 20 seconds 18 inch chair transfer: s UE assist, weight deviation to Rt in movement   GAIT: 01/11/2022:  Independent ambulation c maintained knee flexion in stance on Lt, decreased stance - antalgic gait    TODAY'S TREATMENT  01/11/2022:      Therex: HEP instruction/performance c cues for techniques, handout provided.  Trial set performed of each for comprehension and symptom assessment.  See below for exercise list.   PATIENT EDUCATION:  01/11/2022:  Education details: HEP, POC Person educated:  Patient Education method: Explanation, Demonstration, Verbal cues, and Handouts Education comprehension: verbalized understanding and returned demonstration   HOME EXERCISE PROGRAM: 01/11/2022:    Access Code: [email or web address] URL: [email or web address] Date: 01/11/2022 Prepared by: Scot Jun  Exercises Supine Lower Trunk Rotation - 2 x daily - 7 x weekly - 1 sets - 5 reps - 15 hold Sidelying Hip Abduction - 2 x daily - 7 x weekly - 3 sets - 10-15 reps Seated Straight Leg Heel Taps - 1-2 x daily - 7 x weekly - 3 sets - 10-15 reps Sit to Stand - 1-2 x daily - 7 x weekly - 3 sets - 10-15 reps   ASSESSMENT:  CLINICAL IMPRESSION: Patient is a 61  y.o. who comes to clinic with complaints of chronic low back, Lt knee pain with mobility, strength and movement coordination deficits that impair their ability to perform usual daily and recreational functional activities without increase difficulty/symptoms at this time.  Patient to benefit from skilled PT services to address impairments and limitations to improve to previous level of function without restriction secondary to condition. Objective impairments include Abnormal gait, decreased activity tolerance, decreased balance, decreased coordination, decreased endurance, difficulty walking, decreased strength, impaired flexibility, improper body mechanics, and pain. These impairments are limiting patient from cleaning, community activity, occupation, yard work, and exercise routine . Patient will benefit from skilled PT to address above impairments and improve overall function.  REHAB POTENTIAL: Good  CLINICAL DECISION MAKING: Stable/uncomplicated  EVALUATION COMPLEXITY: Low   GOALS: Goals reviewed with patient? Yes  SHORT TERM GOALS:  STG Name Target Date Goal status  1 Patient will demonstrate independent use of home exercise program to maintain progress from in clinic treatments.  02/01/2022 INITIAL                                 LONG TERM GOALS:   LTG Name Target Date Goal status  1 Patient will demonstrate/report pain at worst less than or equal to 2/10 to facilitate minimal limitation in daily activity  secondary to pain symptoms.  03/22/2022 INITIAL  2 Patient will demonstrate independent use of home exercise program to facilitate ability to maintain/progress functional gains from skilled physical therapy services.  03/22/2022 INITIAL  3 Patient will demonstrate FOTO outcome > or = 56 % to indicated reduced disability due to condition. 03/22/2022 INITIAL  4 Patient will demonstrate bilateral LE MMT 5/5, Lt knee ext dynamometry with 15% of Rt throughout to facilitate ability to perform usual standing, walking, stairs at PLOF s limitation due to symptoms. 03/22/2022 INITIAL  5 Patient will demonstrate bilateral SLS > 30 seconds to facilitate stability in ambulation on even and uneven surfaces.  03/22/2022 INITIAL  6 Patient will demonstrate ascending and descending stairs reciprocal gait s hand rail for stair navigation at PLOF.  03/22/2022 INITIAL        PLAN: PT FREQUENCY: 1-2x/week  PT DURATION: 10 weeks  PLANNED INTERVENTIONS: Therapeutic exercises, Therapeutic activity, Neuro Muscular re-education, Balance training, Gait training, Patient/Family education, Joint mobilization, Stair training, DME instructions, Dry Needling, Electrical stimulation, Cryotherapy, Moist heat, Taping, Ultrasound, Ionotophoresis 4mg /ml Dexamethasone, and Manual therapy.  All included unless contraindicated  PLAN FOR NEXT SESSION: Recheck HEP knowledge, progressive leg strengthening, static balance   Scot Jun, PT, DPT, OCS, ATC 01/11/22  9:50 AM

## 2022-01-11 ENCOUNTER — Ambulatory Visit: Payer: 59 | Admitting: Rehabilitative and Restorative Service Providers"

## 2022-01-11 ENCOUNTER — Encounter: Payer: Self-pay | Admitting: Rehabilitative and Restorative Service Providers"

## 2022-01-11 ENCOUNTER — Other Ambulatory Visit: Payer: Self-pay

## 2022-01-11 DIAGNOSIS — R262 Difficulty in walking, not elsewhere classified: Secondary | ICD-10-CM | POA: Diagnosis not present

## 2022-01-11 DIAGNOSIS — M6281 Muscle weakness (generalized): Secondary | ICD-10-CM

## 2022-01-11 DIAGNOSIS — M25562 Pain in left knee: Secondary | ICD-10-CM | POA: Diagnosis not present

## 2022-01-11 DIAGNOSIS — M5442 Lumbago with sciatica, left side: Secondary | ICD-10-CM | POA: Diagnosis not present

## 2022-01-11 DIAGNOSIS — G8929 Other chronic pain: Secondary | ICD-10-CM

## 2022-01-20 ENCOUNTER — Ambulatory Visit (INDEPENDENT_AMBULATORY_CARE_PROVIDER_SITE_OTHER): Payer: 59 | Admitting: Rehabilitative and Restorative Service Providers"

## 2022-01-20 ENCOUNTER — Encounter: Payer: Self-pay | Admitting: Rehabilitative and Restorative Service Providers"

## 2022-01-20 ENCOUNTER — Other Ambulatory Visit: Payer: Self-pay

## 2022-01-20 DIAGNOSIS — M6281 Muscle weakness (generalized): Secondary | ICD-10-CM

## 2022-01-20 DIAGNOSIS — G8929 Other chronic pain: Secondary | ICD-10-CM

## 2022-01-20 DIAGNOSIS — R262 Difficulty in walking, not elsewhere classified: Secondary | ICD-10-CM

## 2022-01-20 DIAGNOSIS — M25562 Pain in left knee: Secondary | ICD-10-CM

## 2022-01-20 DIAGNOSIS — M5442 Lumbago with sciatica, left side: Secondary | ICD-10-CM

## 2022-01-20 NOTE — Therapy (Signed)
OUTPATIENT PHYSICAL THERAPY TREATMENT NOTE   Patient Name: Lisa Rice MRN: [id number] DOB:1960-12-26, 61 y.o., female Today's Date: 01/20/2022  PCP: Marin Olp, MD REFERRING PROVIDER: Gregor Hams, MD   PT End of Session - 01/20/22 [phone]     Visit Number 2    Number of Visits 20    Date for PT Re-Evaluation 03/22/22    Authorization Type [employer] 10% coinsurance    Progress Note Due on Visit 10    PT Start Time 0800    PT Stop Time 0840    PT Time Calculation (min) 40 min    Activity Tolerance Patient tolerated treatment well    Behavior During Therapy WFL for tasks assessed/performed             Past Medical History:  Diagnosis Date   Allergy    Endometriosis    Infertility, female    IVF 12 years ago   Migraine headache    topamax for prevention at 75mg , imitrex nasal spray 20 mg/act- up to 2 per day q2 hours   Vitamin D deficiency    supplement through GYN   Past Surgical History:  Procedure Laterality Date   FOOT SURGERY     both feet   HYDROCELE EXCISION  1988   hernia, right lower abd   LAPAROSCOPY     removed left ovary   Patient Active Problem List   Diagnosis Date Noted   Mild hyperlipidemia 03/11/2019   Migraine headache    Vitamin D deficiency    ALLERGIC RHINITIS 08/09/2007    REFERRING DIAG: M54.42,G89.29 (ICD-10-CM) - Chronic left-sided low back pain with left-sided sciatica M25.562,G89.29 (ICD-10-CM) - Chronic pain of left knee  ONSET DATE: 06/27/2022  THERAPY DIAG:  Chronic left-sided low back pain with left-sided sciatica  Chronic pain of left knee  Muscle weakness (generalized)  Difficulty in walking, not elsewhere classified  PERTINENT HISTORY: Unremarkable   PRECAUTIONS: None  SUBJECTIVE: Patient indicated having continued twinges in knees at times but nothing worse.  Tried the HEP and reported no trouble with them.   PAIN:  Are you having pain? No NPRS scale: 0/10 at rest Pain location: Lt knee primary, back  secondary Pain orientation: Left   - back of knee, sides of knee at time PAIN TYPE: chronic Pain description: constant knee pain at times,  Aggravating factors: gym based LE activity (squats, lunges), crossed leg, walking prolonged, sitting prolonged, doing down stairs Relieving factors: resting from harder exercise, heat     OBJECTIVE:    DIAGNOSTIC FINDINGS:  Xrays, [facility] [place] (see chart)   PATIENT SURVEYS:  01/11/2022: FOTO intake: 48  predicted:  56   SCREENING FOR RED FLAGS: 01/11/2022:  Bowel or bladder incontinence: No Spinal tumors: No Cauda equina syndrome: No Compression fracture: No   COGNITION:          01/11/2022: Overall cognitive status: Within functional limits for tasks assessed                            SENSATION:          01/11/2022: Light touch: Appears intact             MUSCLE LENGTH: 01/11/2022:  Supine passive SLR 90 degrees bilateral no complaints   POSTURE:  01/11/2022:  no specific abnormality noted   PALPATION: 01/11/2022:  No specific tenderness noted around Lt knee today   LUMBAR AROM/PROM   A/PROM A/PROM  01/11/2022  Flexion No complaints, to ankles  Extension 100 % WFL  Right lateral flexion    Left lateral flexion    Right rotation    Left rotation     (Blank rows = not tested)   LE AROM/PROM:   A/PROM Right 01/11/2022 Left 01/11/2022  Hip flexion      Hip extension      Hip abduction      Hip adduction      Hip internal rotation      Hip external rotation      Knee flexion [facility]  Knee extension 0 0  Ankle dorsiflexion      Ankle plantarflexion      Ankle inversion      Ankle eversion       (Blank rows = not tested)   LE MMT:   MMT Right 01/11/2022 Left 01/11/2022  Hip flexion 5/5 5/5  Hip extension      Hip abduction   4/5  Hip adduction      Hip internal rotation      Hip external rotation      Knee flexion 5/5 4/5 c pain back of Lt knee  Knee extension 5/5 58.6, 50 lbs 4/5 29, 37 Lbs Mild pain anterior Lt  knee  Ankle dorsiflexion 5/5 5/5  Ankle plantarflexion      Ankle inversion      Ankle eversion       (Blank rows = not tested)   LUMBAR SPECIAL TESTS:  01/11/2022: - slump bilateral   FUNCTIONAL TESTS:  01/20/2022:  Lt SLS:  14 seconds  01/11/2022:  Lt SLS:  7 seconds   Rt SLS: 20 seconds 18 inch chair transfer: s UE assist, weight deviation to Rt in movement    GAIT: 01/11/2022:  Independent ambulation c maintained knee flexion in stance on Lt, decreased stance - antalgic gait       TODAY'S TREATMENT  01/20/2022:                       Therex:        UBE LE only lvl 4.0 7 mins    Leg press double leg x 15 75 lbs, single leg Lt 37 lbs 2 x 15    Leg extension machine double leg up, Lt lowering 2 x 10 10 lbs    Supine hip extension SLR isometric push into table Lt 15 sec x 5    Seated slr x 15 bilateral    Incline gastroc stretch bilateral 30 sec x 3     Cues for home use of machine exercise c cues for set up, adjustments based off symptoms.       01/11/2022:                       Therex:       HEP instruction/performance c cues for techniques, handout provided.  Trial set performed of each for comprehension and symptom assessment.  See below for exercise list.     PATIENT EDUCATION:  01/11/2022:  Education details: HEP, POC Person educated: Patient Education method: Explanation, Demonstration, Verbal cues, and Handouts Education comprehension: verbalized understanding and returned demonstration     HOME EXERCISE PROGRAM: 01/11/2022:   Access Code: [email or web address] URL: [email or web address] Date: 01/11/2022 Prepared by: Scot Jun   Exercises Supine Lower Trunk Rotation - 2 x daily - 7 x weekly - 1 sets - 5 reps - 15 hold Sidelying Hip  Abduction - 2 x daily - 7 x weekly - 3 sets - 10-15 reps Seated Straight Leg Heel Taps - 1-2 x daily - 7 x weekly - 3 sets - 10-15 reps Sit to Stand - 1-2 x daily - 7 x weekly - 3 sets - 10-15 reps     ASSESSMENT:    CLINICAL IMPRESSION: Good tolerance overall with progression of strengthening intervention with no concordant pain noted.  Review of HEP showed good knowledge at this time.  Complaint of sitting pains noted at various times (usually noted on medial aspect of proximal lower leg.  Cues given on intermittent movement while sitting.  Range continued to show [facility] for flexion at this time.    REHAB POTENTIAL: Good   CLINICAL DECISION MAKING: Stable/uncomplicated   EVALUATION COMPLEXITY: Low     GOALS: Goals reviewed with patient? Yes   SHORT TERM GOALS:   STG Name Target Date Goal status  1 Patient will demonstrate independent use of home exercise program to maintain progress from in clinic treatments.  02/01/2022 On going Assessed 01/20/2022                                                          LONG TERM GOALS:    LTG Name Target Date Goal status  1 Patient will demonstrate/report pain at worst less than or equal to 2/10 to facilitate minimal limitation in daily activity secondary to pain symptoms.  03/22/2022 INITIAL  2 Patient will demonstrate independent use of home exercise program to facilitate ability to maintain/progress functional gains from skilled physical therapy services.  03/22/2022 INITIAL  3 Patient will demonstrate FOTO outcome > or = 56 % to indicated reduced disability due to condition. 03/22/2022 INITIAL  4 Patient will demonstrate bilateral LE MMT 5/5, Lt knee ext dynamometry with 15% of Rt throughout to facilitate ability to perform usual standing, walking, stairs at PLOF s limitation due to symptoms. 03/22/2022 INITIAL  5 Patient will demonstrate bilateral SLS > 30 seconds to facilitate stability in ambulation on even and uneven surfaces.  03/22/2022 INITIAL  6 Patient will demonstrate ascending and descending stairs reciprocal gait s hand rail for stair navigation at PLOF.  03/22/2022 INITIAL             PLAN: PT FREQUENCY: 1-2x/week   PT DURATION: 10 weeks    PLANNED INTERVENTIONS: Therapeutic exercises, Therapeutic activity, Neuro Muscular re-education, Balance training, Gait training, Patient/Family education, Joint mobilization, Stair training, DME instructions, Dry Needling, Electrical stimulation, Cryotherapy, Moist heat, Taping, Ultrasound, Ionotophoresis 4mg /ml Dexamethasone, and Manual therapy.  All included unless contraindicated   PLAN FOR NEXT SESSION: Continued quad strengthening, dynamic balance control      Scot Jun, PT, DPT, OCS, ATC 01/20/22  8:39 AM

## 2022-01-23 ENCOUNTER — Encounter: Payer: 59 | Admitting: Physical Therapy

## 2022-01-25 ENCOUNTER — Encounter: Payer: 59 | Admitting: Rehabilitative and Restorative Service Providers"

## 2022-01-30 ENCOUNTER — Encounter: Payer: 59 | Admitting: Rehabilitative and Restorative Service Providers"

## 2022-02-01 ENCOUNTER — Encounter: Payer: 59 | Admitting: Rehabilitative and Restorative Service Providers"

## 2022-02-07 ENCOUNTER — Encounter: Payer: 59 | Admitting: Rehabilitative and Restorative Service Providers"

## 2022-02-09 ENCOUNTER — Encounter: Payer: 59 | Admitting: Rehabilitative and Restorative Service Providers"

## 2022-02-09 ENCOUNTER — Telehealth: Payer: Self-pay | Admitting: Rehabilitative and Restorative Service Providers"

## 2022-02-09 NOTE — Telephone Encounter (Signed)
Called and left message about missed appointment today and reminded of next appointment time on March 21 at 4pm. ? ?Scot Jun, PT, DPT, OCS, ATC ?02/09/22  4:17 PM ? ? ?

## 2022-02-13 ENCOUNTER — Ambulatory Visit: Payer: 59 | Admitting: Family Medicine

## 2022-02-14 ENCOUNTER — Encounter: Payer: 59 | Admitting: Rehabilitative and Restorative Service Providers"

## 2022-02-16 ENCOUNTER — Encounter: Payer: 59 | Admitting: Rehabilitative and Restorative Service Providers"

## 2022-02-21 ENCOUNTER — Encounter: Payer: 59 | Admitting: Rehabilitative and Restorative Service Providers"

## 2022-02-23 ENCOUNTER — Encounter: Payer: 59 | Admitting: Rehabilitative and Restorative Service Providers"

## 2022-02-28 ENCOUNTER — Encounter: Payer: 59 | Admitting: Rehabilitative and Restorative Service Providers"

## 2022-03-02 ENCOUNTER — Encounter: Payer: 59 | Admitting: Rehabilitative and Restorative Service Providers"

## 2022-03-15 NOTE — Progress Notes (Signed)
61 y.o. G0P0001 Married White or Caucasian Not Hispanic or Latino female here for annual exam.  No vaginal bleeding. ? ?She is sexually active, intermittent dyspareunia, may be dryness.  ? ?No bladder or bowel c/o.  ?  ?FH of breast cancer. Negative BRCA testing. TC risk of breast cancer was estimated at 19.2% in 12/13.  ? ?The patient was seen in 3/21 with AUB and pyometra. Ultrasound without other findings, office curettage with drainage of abscess, treated with antibiotics. ? ?Patient's last menstrual period was 12/09/2019 (approximate).          ?Sexually active: Yes.    ?The current method of family planning is post menopausal status.    ?Exercising: Yes.     Walking and working out with Physiological scientist ?Smoker:  no ? ?Health Maintenance: ?Pap:  2/22/21Negative, Hr HPV neg;  12-19-17 neg HPV HR neg ?History of abnormal Pap:  no ?MMG:  07/19/21 Bi-rads 1 neg  ?BMD:   none  ?Colonoscopy: 2013 f/u 10 years  ?TDaP:  2020 ?Gardasil: n/a ? ? reports that she has never smoked. She has never used smokeless tobacco. She reports current alcohol use. She reports that she does not use drugs.  1-2 glasses a wine a week at most. She teaches pre-K. She has son, 63. He is a Paramedic at [employer].  ? ?Past Medical History:  ?Diagnosis Date  ? Allergy   ? Endometriosis   ? Infertility, female   ? IVF 12 years ago  ? Migraine headache   ? topamax for prevention at 48m, imitrex nasal spray 20 mg/act- up to 2 per day q2 hours  ? Vitamin D deficiency   ? supplement through GYN  ? ? ?Past Surgical History:  ?Procedure Laterality Date  ? FOOT SURGERY    ? both feet  ? HYDROCELE EXCISION  1988  ? hernia, right lower abd  ? LAPAROSCOPY    ? removed left ovary  ? ? ?Current Outpatient Medications  ?Medication Sig Dispense Refill  ? Acetaminophen (TYLENOL PO) Take by mouth.    ? Cholecalciferol (VITAMIN D3 PO) Take 2,000 Int'l Units by mouth.     ? Cyanocobalamin (VITAMIN B-12 PO) Take by mouth.    ? ?No current facility-administered medications  for this visit.  ? ? ?Family History  ?Problem Relation Age of Onset  ? Breast cancer Mother 465 ?     passed at 436 ? Prostate cancer Father 557 ?     lived into 848s ? Uterine cancer Sister 673 ?     [place] ? Kidney cancer Sister 645 ? Depression Brother   ?     suicide  ? ? ?Review of Systems  ?All other systems reviewed and are negative. ? ?Exam:   ?BP 126/72   Pulse 62   Ht 5' 4" (1.626 m)   Wt 184 lb (83.5 kg)   LMP 12/09/2019 (Approximate)   SpO2 99%   BMI 31.58 kg/m?   Weight change: _0 @ Height:   Height: 5' 4" (162.6 cm)  ?Ht Readings from Last 3 Encounters:  ?03/22/22 5' 4" (1.626 m)  ?12/28/21 5' 4" (1.626 m)  ?12/26/21 5' 4" (1.626 m)  ? ? ?General appearance: alert, cooperative and appears stated age ?Head: Normocephalic, without obvious abnormality, atraumatic ?Neck: no adenopathy, supple, symmetrical, trachea midline and thyroid normal to inspection and palpation ?Lungs: clear to auscultation bilaterally ?Cardiovascular: regular rate and rhythm ?Breasts: normal appearance, no masses or  tenderness ?Abdomen: soft, non-tender; non distended,  no masses,  no organomegaly ?Extremities: extremities normal, atraumatic, no cyanosis or edema ?Skin: Skin color, texture, turgor normal. No rashes or lesions ?Lymph nodes: Cervical, supraclavicular, and axillary nodes normal. ?No abnormal inguinal nodes palpated ?Neurologic: Grossly normal ? ? ?Pelvic: External genitalia:  no lesions ?             Urethra:  normal appearing urethra with no masses, tenderness or lesions ?             Bartholins and Skenes: normal    ?             Vagina: normal appearing vagina with normal color and discharge, no lesions ?             Cervix: no lesions ?              ?Bimanual Exam:  Uterus:   no masses or tenderness ?             Adnexa: no mass, fullness, tenderness ?              Rectovaginal: Confirms ?              Anus:  normal sphincter tone, no lesions ? ? ?1. Well woman exam ?Discussed  breast self exam ?Discussed calcium and vit D intake ?Labs with primary ?Mammogram at the end of 8/23 ? ?2. Colon cancer screening ?Due ?- Ambulatory referral to Gastroenterology ? ?3. Family history of breast cancer ?She has a 19.2% risk of breast cancer ?Encouraged breast self awareness ?Discussed the option of abbreviated MRI, she would like to do this. Will schedule it 6 months after her mammogram which is due at the end of 8/23.  ? ?4. Dyspareunia, female ?Sample of lubricant given ?She should control the rate and depth of penetration ?If she continues to have issues she should return.  ? ?

## 2022-03-22 ENCOUNTER — Encounter: Payer: Self-pay | Admitting: Obstetrics and Gynecology

## 2022-03-22 ENCOUNTER — Telehealth: Payer: Self-pay

## 2022-03-22 ENCOUNTER — Ambulatory Visit (INDEPENDENT_AMBULATORY_CARE_PROVIDER_SITE_OTHER): Payer: 59 | Admitting: Obstetrics and Gynecology

## 2022-03-22 VITALS — BP 126/72 | HR 62 | Ht 64.0 in | Wt 184.0 lb

## 2022-03-22 DIAGNOSIS — Z1211 Encounter for screening for malignant neoplasm of colon: Secondary | ICD-10-CM | POA: Diagnosis not present

## 2022-03-22 DIAGNOSIS — Z9189 Other specified personal risk factors, not elsewhere classified: Secondary | ICD-10-CM

## 2022-03-22 DIAGNOSIS — Z803 Family history of malignant neoplasm of breast: Secondary | ICD-10-CM | POA: Diagnosis not present

## 2022-03-22 DIAGNOSIS — Z01419 Encounter for gynecological examination (general) (routine) without abnormal findings: Secondary | ICD-10-CM

## 2022-03-22 DIAGNOSIS — N941 Unspecified dyspareunia: Secondary | ICD-10-CM

## 2022-03-22 NOTE — Telephone Encounter (Signed)
-----   Message from Salvadore Dom, MD sent at 03/22/2022  8:28 AM EDT ----- ?Please order an abbreviated breast MRI in this patient for early March of 2024. Risk of breast cancer is 19%. ?Thanks, ?Sharee Pimple ? ?

## 2022-03-22 NOTE — Patient Instructions (Signed)

## 2022-08-21 ENCOUNTER — Encounter: Payer: Self-pay | Admitting: *Deleted

## 2022-08-23 ENCOUNTER — Telehealth: Payer: Self-pay | Admitting: Internal Medicine

## 2022-08-23 NOTE — Telephone Encounter (Signed)
Called patient to schedule procedure for November calender , Patient did not answer. Did not leave a voicemail.

## 2022-08-24 ENCOUNTER — Other Ambulatory Visit: Payer: Self-pay | Admitting: Obstetrics and Gynecology

## 2022-08-24 ENCOUNTER — Encounter: Payer: Self-pay | Admitting: Internal Medicine

## 2022-08-24 DIAGNOSIS — Z1231 Encounter for screening mammogram for malignant neoplasm of breast: Secondary | ICD-10-CM

## 2022-09-06 ENCOUNTER — Ambulatory Visit
Admission: RE | Admit: 2022-09-06 | Discharge: 2022-09-06 | Disposition: A | Discharge status: Home / Self Care | Payer: 59 | Source: Ambulatory Visit | Attending: Obstetrics and Gynecology | Admitting: Obstetrics and Gynecology

## 2022-09-06 DIAGNOSIS — Z1231 Encounter for screening mammogram for malignant neoplasm of breast: Secondary | ICD-10-CM

## 2022-09-10 IMAGING — DX DG KNEE AP/LAT W/ SUNRISE*L*
3 series · 3 of 3 positions shown · non-contrast
Comparison: None.

CLINICAL DATA: Left knee pain.

EXAM:
LEFT KNEE 3 VIEWS

[knee ap]
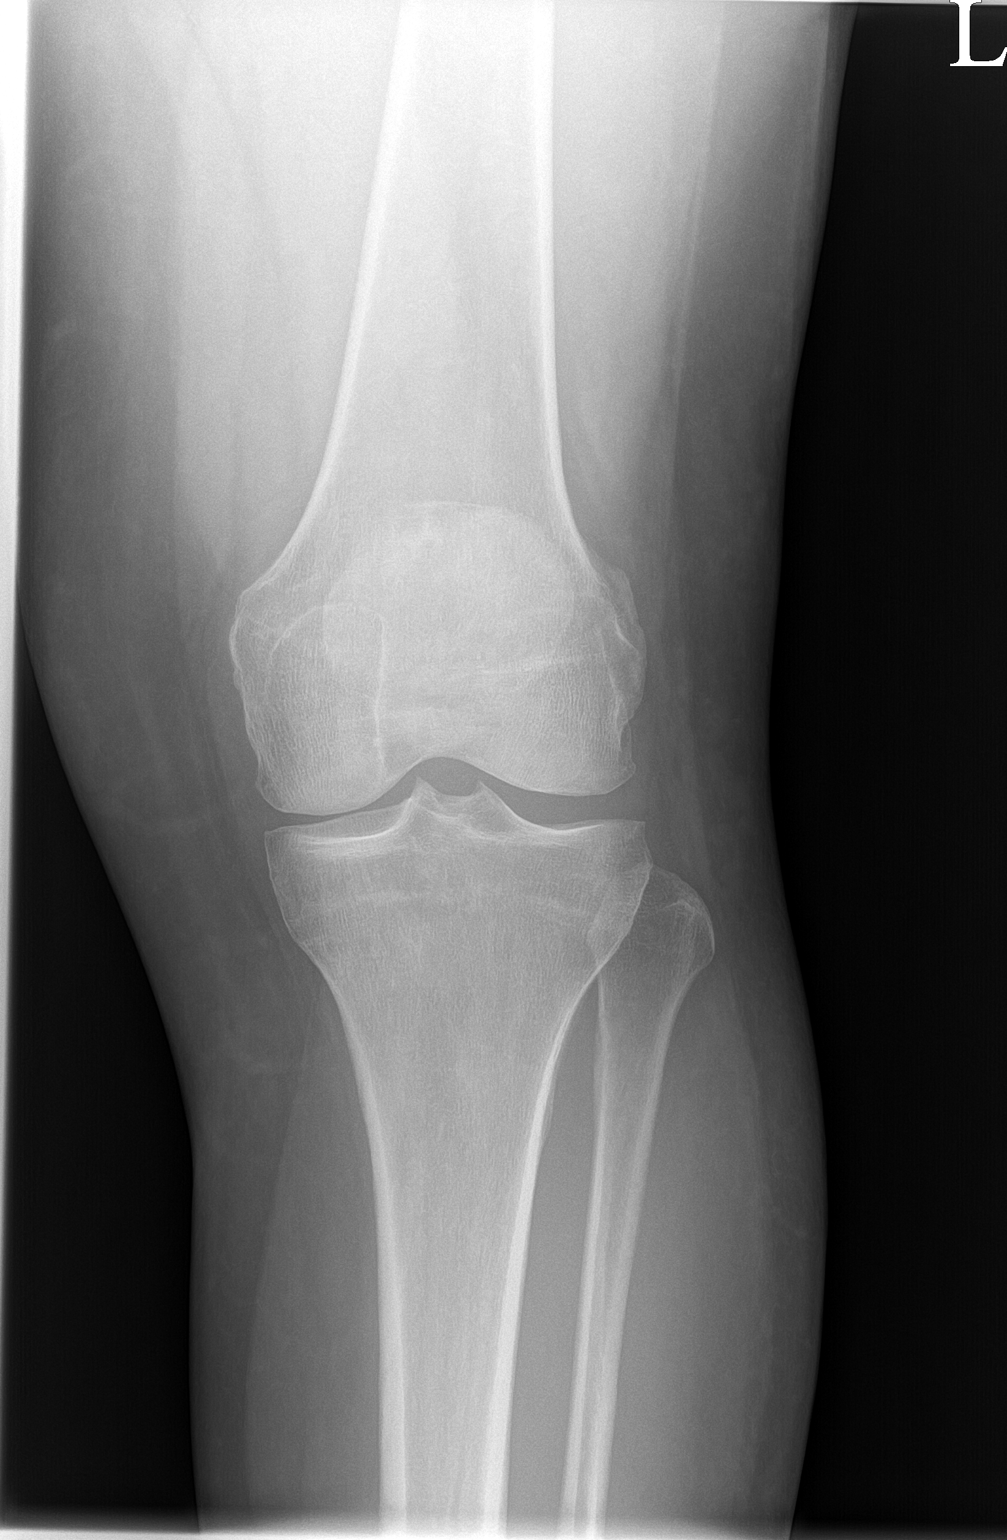

[knee lat]
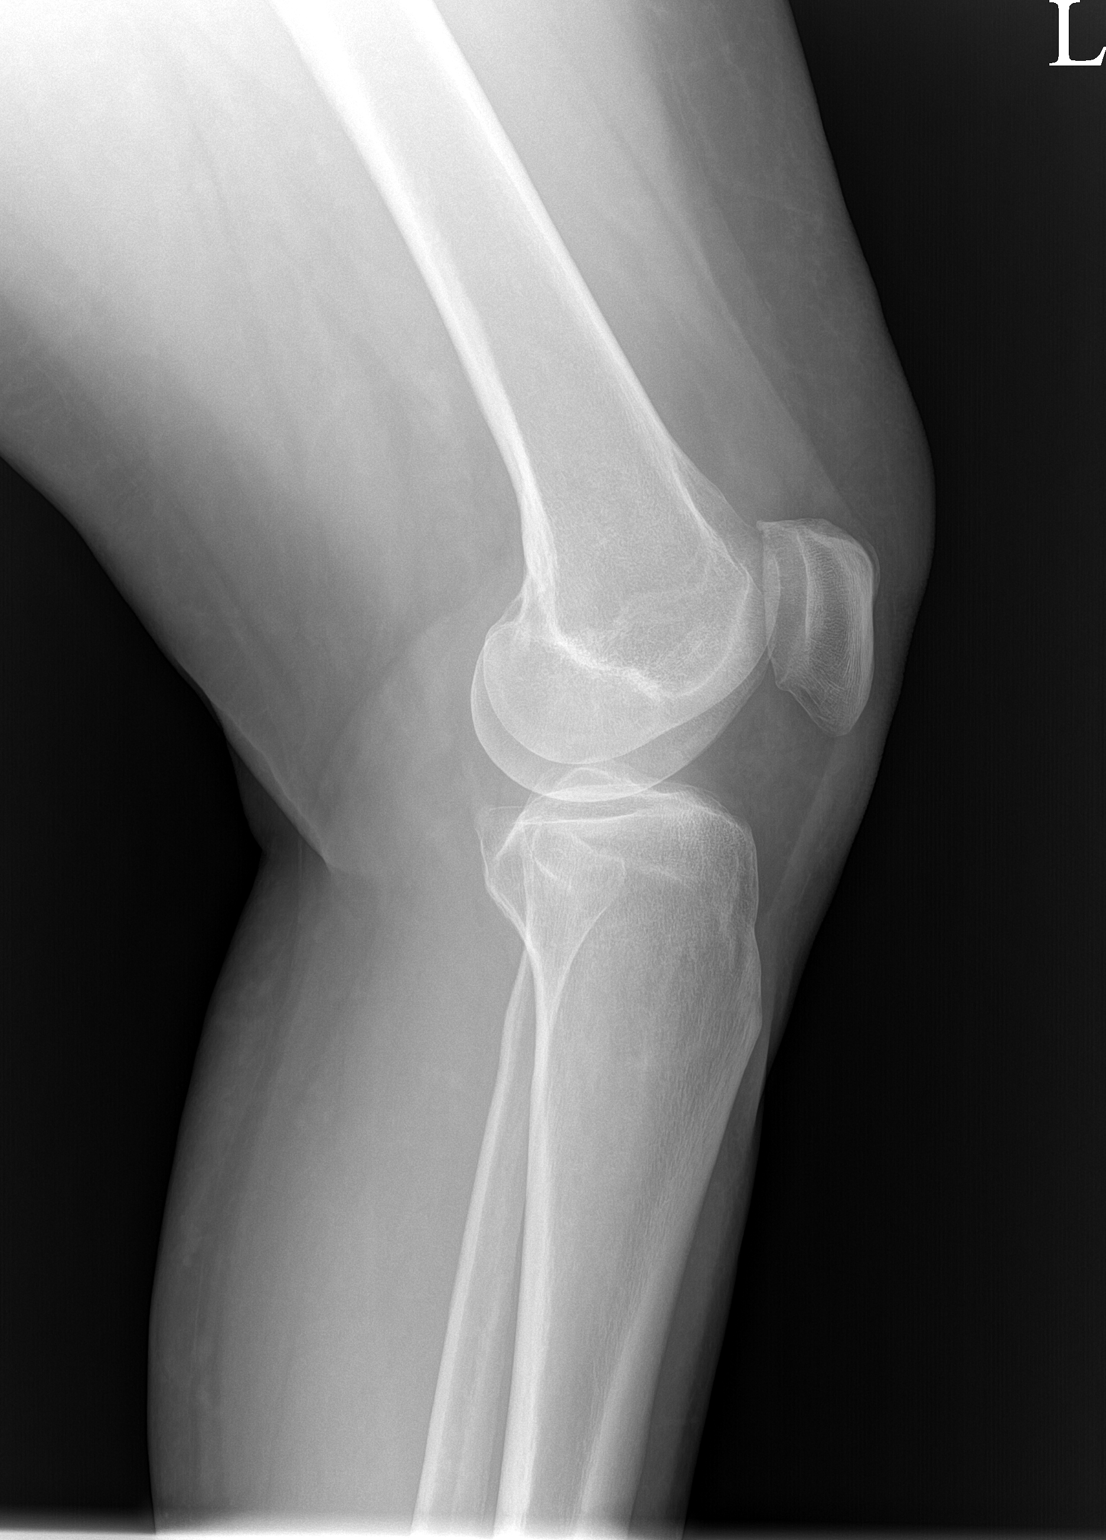

[patella]
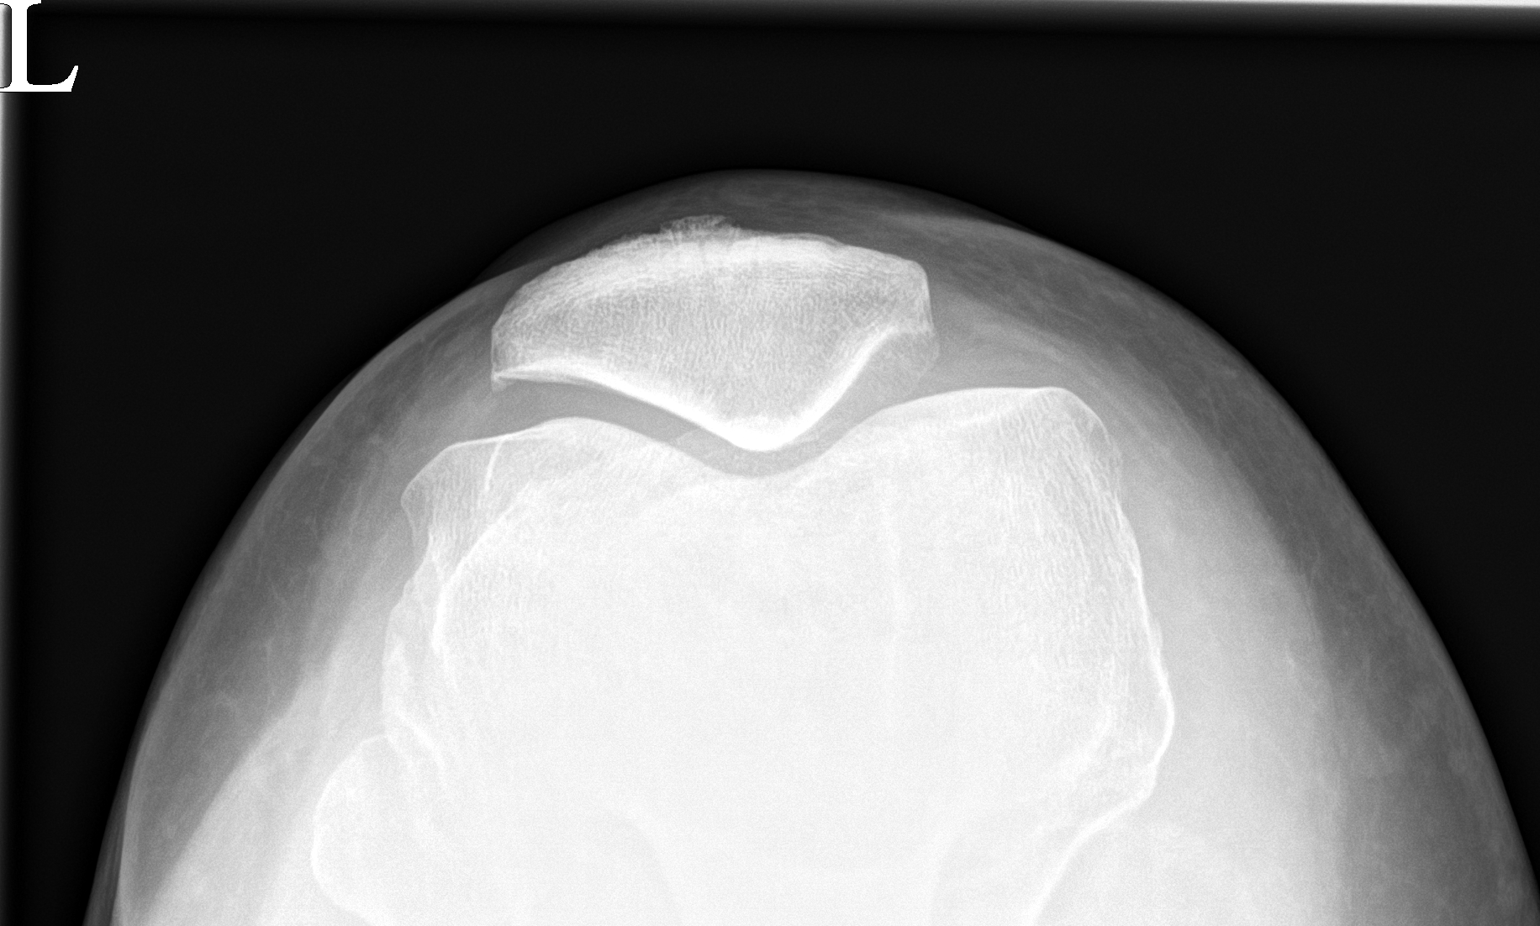

[3 of 3 positions shown; findings below may reference images not displayed]

FINDINGS: Normal bone mineralization. Mild medial compartment joint space
narrowing. Mild superior and lateral patellar degenerative
osteophytosis. Tiny joint effusion. No acute fracture or
dislocation.
IMPRESSION: Mild medial and patellofemoral compartment osteoarthritis.

## 2022-10-23 ENCOUNTER — Ambulatory Visit (AMBULATORY_SURGERY_CENTER): Payer: Self-pay | Admitting: *Deleted

## 2022-10-23 VITALS — Ht 64.0 in | Wt 183.4 lb

## 2022-10-23 DIAGNOSIS — Z1211 Encounter for screening for malignant neoplasm of colon: Secondary | ICD-10-CM

## 2022-10-23 MED ORDER — NA SULFATE-K SULFATE-MG SULF 17.5-3.13-1.6 GM/177ML PO SOLN: 1.0000 | Freq: Once | ORAL | 0 refills | Status: AC

## 2022-10-23 NOTE — Progress Notes (Signed)

## 2022-11-07 ENCOUNTER — Encounter: Payer: Self-pay | Admitting: Internal Medicine

## 2022-11-09 ENCOUNTER — Encounter: Payer: Self-pay | Admitting: *Deleted

## 2022-11-13 ENCOUNTER — Encounter: Payer: Self-pay | Admitting: Internal Medicine

## 2022-11-13 ENCOUNTER — Ambulatory Visit (AMBULATORY_SURGERY_CENTER): Payer: 59 | Admitting: Internal Medicine

## 2022-11-13 VITALS — BP 125/83 | HR 59 | Temp 98.6°F | Resp 10 | Ht 64.0 in | Wt 183.4 lb

## 2022-11-13 DIAGNOSIS — Z1211 Encounter for screening for malignant neoplasm of colon: Secondary | ICD-10-CM | POA: Diagnosis present

## 2022-11-13 DIAGNOSIS — D124 Benign neoplasm of descending colon: Secondary | ICD-10-CM | POA: Diagnosis not present

## 2022-11-13 MED ORDER — SODIUM CHLORIDE 0.9 % IV SOLN: 500.0000 mL | Freq: Once | INTRAVENOUS | Status: DC

## 2022-11-13 NOTE — Progress Notes (Signed)
Called to room to assist during endoscopic procedure.  Patient ID and intended procedure confirmed with present staff. Received instructions for my participation in the procedure from the performing physician.  

## 2022-11-13 NOTE — Progress Notes (Signed)
Pt's states no medical or surgical changes since previsit or office visit. 

## 2022-11-13 NOTE — Progress Notes (Signed)
GASTROENTEROLOGY PROCEDURE H&P NOTE   Primary Care Physician: Marin Olp, MD    Reason for Procedure:  Colon cancer screening  Plan:    Colonoscopy  Patient is appropriate for endoscopic procedure(s) in the ambulatory ([facility]) setting.  The nature of the procedure, as well as the risks, benefits, and alternatives were carefully and thoroughly reviewed with the patient. Ample time for discussion and questions allowed. The patient understood, was satisfied, and agreed to proceed.     HPI: Lisa Rice is a 61 y.o. female who presents for colonoscopy.  Medical history as below.  Tolerated the prep.  No recent chest pain or shortness of breath.  No abdominal pain today.  Past Medical History:  Diagnosis Date   Allergy    Arthritis    left knee   Endometriosis    Infertility, female    IVF 12 years ago   Migraine headache    topamax for prevention at '75mg'$ , imitrex nasal spray 20 mg/act- up to 2 per day q2 hours   Vitamin D deficiency    supplement through GYN    Past Surgical History:  Procedure Laterality Date   COLONOSCOPY     FOOT SURGERY     both feet   HYDROCELE EXCISION  1988   hernia, right lower abd   LAPAROSCOPY     removed left ovary    Prior to Admission medications   Medication Sig Start Date End Date Taking? Authorizing Provider  Acetaminophen (TYLENOL PO) Take by mouth as needed.   Yes [provider]  Cholecalciferol (VITAMIN D3 PO) Take 2,000 Int'l Units by mouth.    Yes [provider]  Cyanocobalamin (VITAMIN B-12 PO) Take by mouth.   Yes [provider]    Current Outpatient Medications  Medication Sig Dispense Refill   Acetaminophen (TYLENOL PO) Take by mouth as needed.     Cholecalciferol (VITAMIN D3 PO) Take 2,000 Int'l Units by mouth.      Cyanocobalamin (VITAMIN B-12 PO) Take by mouth.     Current Facility-Administered Medications  Medication Dose Route Frequency Provider Last Rate Last Admin   0.9 %   sodium chloride infusion  500 mL Intravenous Once Maxene Byington, Lajuan Lines, MD        Allergies as of 11/13/2022   (No Known Allergies)    Family History  Problem Relation Age of Onset   Breast cancer Mother 84       passed at 48   Prostate cancer Father 17       lived into 88s   Uterine cancer Sister 30       [place]   Kidney cancer Sister 25   Depression Brother        suicide   Colon cancer Neg Hx    Colon polyps Neg Hx    Crohn's disease Neg Hx    Esophageal cancer Neg Hx    Rectal cancer Neg Hx    Stomach cancer Neg Hx    Ulcerative colitis Neg Hx     Social History   Socioeconomic History   Marital status: Married    Spouse name: Not on file   Number of children: 1   Years of education: Not on file   Highest education level: Not on file  Occupational History   Occupation: Pharmacist, hospital  Tobacco Use   Smoking status: Never    Passive exposure: Past   Smokeless tobacco: Never  Vaping Use   Vaping Use: Never  used  Substance and Sexual Activity   Alcohol use: Yes    Alcohol/week: 0.0 - 2.0 standard drinks of alcohol    Comment: wine once a week   Drug use: No   Sexual activity: Not Currently    Partners: Male    Birth control/protection: Abstinence  Other Topics Concern   Not on file  Social History Narrative   Married. 1 adopted child 33 year old son (from [place]) in 2018   Family friend of Dr. 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    Prior Had been  Oncologist at [employer].       Hobbies: times outside, walking dog, time with friends and family, following sons sports   Social Determinants of Health   Financial Resource Strain: Not on file  Food Insecurity: Not on file  Transportation Needs: Not on file  Physical Activity: Not on file  Stress: Not on file  Social Connections: Not on file  Intimate Partner Violence: Not on file     Physical Exam: Vital signs in last 24 hours: '@BP'$  121/78   Pulse 73   Temp 98.6 F (37 C) (Temporal)   Ht '5\' 4"'$  (1.626 m)   Wt 183 lb 6.4 oz (83.2 kg)   LMP 12/09/2019 (Approximate)   SpO2 98%   BMI 31.48 kg/m  GEN: NAD EYE: Sclerae anicteric ENT: MMM CV: Non-tachycardic Pulm: CTA b/l GI: Soft, NT/ND NEURO:  Alert & Oriented x 3   Zenovia Jarred, MD [facility]  11/13/2022 9:15 AM

## 2022-11-13 NOTE — Patient Instructions (Signed)
Read all of the handouts given to  you by your recovery room nurse.  Resume your previous medications.  YOU HAD AN ENDOSCOPIC PROCEDURE TODAY AT [facility]:   Refer to the procedure report that was given to you for any specific questions about what was found during the examination.  If the procedure report does not answer your questions, please call your gastroenterologist to clarify.  If you requested that your care partner not be given the details of your procedure findings, then the procedure report has been included in a sealed envelope for you to review at your convenience later.  YOU SHOULD EXPECT: Some feelings of bloating in the abdomen. Passage of more gas than usual.  Walking can help get rid of the air that was put into your GI tract during the procedure and reduce the bloating. If you had a lower endoscopy (such as a colonoscopy or flexible sigmoidoscopy) you may notice spotting of blood in your stool or on the toilet paper. If you underwent a bowel prep for your procedure, you may not have a normal bowel movement for a few days.  Please Note:  You might notice some irritation and congestion in your nose or some drainage.  This is from the oxygen used during your procedure.  There is no need for concern and it should clear up in a day or so.  SYMPTOMS TO REPORT IMMEDIATELY:  Following lower endoscopy (colonoscopy or flexible sigmoidoscopy):  Excessive amounts of blood in the stool  Significant tenderness or worsening of abdominal pains  Swelling of the abdomen that is new, acute  Fever of 100F or higher   For urgent or emergent issues, a gastroenterologist can be reached at any hour by calling [phone]. Do not use MyChart messaging for urgent concerns.    DIET:  We do recommend a small meal at first, but then you may proceed to your regular diet.  ACTIVITY:  You should plan to take it easy for the rest of today and you should NOT DRIVE or use heavy  machinery until tomorrow (because of the sedation medicines used during the test).    FOLLOW UP: Our staff will call the number listed on your records the next business day following your procedure.  We will call around 7:15- 8:00 am to check on you and address any questions or concerns that you may have regarding the information given to you following your procedure. If we do not reach you, we will leave a message.     If any biopsies were taken you will be contacted by phone or by letter within the next 1-3 weeks.  Please call us at [phone] if you have not heard about the biopsies in 3 weeks.    SIGNATURES/CONFIDENTIALITY: You and/or your care partner have signed paperwork which will be entered into your electronic medical record.  These signatures attest to the fact that that the information above on your After Visit Summary has been reviewed and is understood.  Full responsibility of the confidentiality of this discharge information lies with you and/or your care-partner.

## 2022-11-13 NOTE — Progress Notes (Signed)
Sedate, gd SR, tolerated procedure well, VSS, report to RN 

## 2022-11-13 NOTE — Op Note (Signed)
[facility] Patient Name: Lisa Rice Procedure Date: 11/13/2022 9:22 AM MRN: [id number] Endoscopist: Jerene Bears , MD, [id number] Age: 61 Referring MD:  Date of Birth: 01-31-1961 Gender: Female Account #: [id number] Procedure:                Colonoscopy Indications:              Screening for colorectal malignant neoplasm, Last                            colonoscopy 10 years ago Medicines:                Monitored Anesthesia Care Procedure:                Pre-Anesthesia Assessment:                           - Prior to the procedure, a History and Physical                            was performed, and patient medications and                            allergies were reviewed. The patient's tolerance of                            previous anesthesia was also reviewed. The risks                            and benefits of the procedure and the sedation                            options and risks were discussed with the patient.                            All questions were answered, and informed consent                            was obtained. Prior Anticoagulants: The patient has                            taken no anticoagulant or antiplatelet agents. ASA                            Grade Assessment: II - A patient with mild systemic                            disease. After reviewing the risks and benefits,                            the patient was deemed in satisfactory condition to                            undergo the procedure.  After obtaining informed consent, the colonoscope                            was passed under direct vision. Throughout the                            procedure, the patient's blood pressure, pulse, and                            oxygen saturations were monitored continuously. The                            PCF-HQ190L Colonoscope was introduced through the                            anus and advanced to the cecum,  identified by the                            ileocecal valve. The colonoscopy was performed                            without difficulty. The patient tolerated the                            procedure well. The quality of the bowel                            preparation was good. The ileocecal valve,                            appendiceal orifice, and rectum were photographed. Scope In: 9:31:39 AM Scope Out: 9:48:40 AM Scope Withdrawal Time: 0 hours 13 minutes 27 seconds  Total Procedure Duration: 0 hours 17 minutes 1 second  Findings:                 The digital rectal exam was normal.                           Two sessile polyps were found in the descending                            colon. The polyps were 4 to 6 mm in size. These                            polyps were removed with a cold snare. Resection                            and retrieval were complete.                           A few medium-mouthed and small-mouthed diverticula                            were found in the sigmoid colon and hepatic flexure.  The retroflexed view of the distal rectum and anal                            verge was normal and showed no anal or rectal                            abnormalities. Complications:            No immediate complications. Estimated Blood Loss:     Estimated blood loss was minimal. Impression:               - Two 4 to 6 mm polyps in the descending colon,                            removed with a cold snare. Resected and retrieved.                           - Mild diverticulosis in the sigmoid colon and at                            the hepatic flexure.                           - The distal rectum and anal verge are normal on                            retroflexion view. Recommendation:           - Patient has a contact number available for                            emergencies. The signs and symptoms of potential                            delayed  complications were discussed with the                            patient. Return to normal activities tomorrow.                            Written discharge instructions were provided to the                            patient.                           - Resume previous diet.                           - Continue present medications.                           - Await pathology results.                           - Repeat colonoscopy is recommended. The  colonoscopy date will be determined after pathology                            results from today's exam become available for                            review. Jerene Bears, MD 11/13/2022 9:52:46 AM This report has been signed electronically.

## 2022-11-14 ENCOUNTER — Telehealth: Payer: Self-pay

## 2022-11-14 NOTE — Telephone Encounter (Signed)
Follow up call to pt, lm for pt to call if having any difficulty with normal activities or eating and drinking.  Also to call if any other questions or concerns.  

## 2022-11-15 ENCOUNTER — Encounter: Payer: Self-pay | Admitting: Internal Medicine

## 2023-01-18 NOTE — Telephone Encounter (Signed)
Per JJ: "6 months after mammo for breast MRI is perfect."   Will wait to place order until end of 01/2023.

## 2023-02-22 NOTE — Telephone Encounter (Signed)
LDVM on machine per DPR to remind pt that it is time for her Breast MRI. Advised her that I will place the order and someone from Imaging location will contact her to schedule.   While placing order, I inquired with Dr. Talbert Nan re: at what percentage is a pt considered "high risk" for breast cancer and was advised 20%.   Pt was calculated @ 19% per last year's calculation in which supplemental breast cancer screening isnt usually covered by insurance.  Also [facility] does not offer abbreviated MRIs any longer. Should I call the patient back and state that we can revisit this subject at her next AEX or give her the option to order traditional MRI and attempt a PA w/ ICD-10 codes pend. Please advise.

## 2023-02-22 NOTE — Telephone Encounter (Signed)
If she returns your call you can review the information with her. Otherwise I can discuss it with her at her next annual which is due next month.

## 2023-02-26 NOTE — Telephone Encounter (Signed)
LVMTCB

## 2023-03-01 NOTE — Telephone Encounter (Signed)
Pt returned my call, will try to CB at my earliest convenience.

## 2023-03-02 NOTE — Telephone Encounter (Signed)
Pt is agreeable to making appt for traditional MRI and seeing if insurance will cover any of the costs. Will place order, pt will schedule appt, and we will have PA coordinator contact insurance.   Order placed.

## 2023-03-06 NOTE — Telephone Encounter (Signed)
BC[facility] 1st attempt to contact pt per order notes on 03/03/2023. LM.

## 2023-03-14 NOTE — Telephone Encounter (Signed)
Pt scheduled for MRI on 04/06/2023. Msg sent to PA coordinator to start PA process.

## 2023-03-30 ENCOUNTER — Ambulatory Visit: Payer: 59 | Admitting: Family Medicine

## 2023-04-04 NOTE — Telephone Encounter (Signed)
PA authorized. Will route to provider for final review and close encounter.

## 2023-04-06 ENCOUNTER — Ambulatory Visit
Admission: RE | Admit: 2023-04-06 | Discharge: 2023-04-06 | Disposition: A | Discharge status: Home / Self Care | Payer: 59 | Source: Ambulatory Visit | Attending: Obstetrics and Gynecology | Admitting: Obstetrics and Gynecology

## 2023-04-06 DIAGNOSIS — Z803 Family history of malignant neoplasm of breast: Secondary | ICD-10-CM

## 2023-04-06 DIAGNOSIS — Z9189 Other specified personal risk factors, not elsewhere classified: Secondary | ICD-10-CM

## 2023-04-06 MED ORDER — GADOPICLENOL 0.5 MMOL/ML IV SOLN
8.0000 mL | Freq: Once | INTRAVENOUS | Status: AC | PRN
  Administered 2023-04-06: 8 mL via INTRAVENOUS

## 2023-04-24 NOTE — Progress Notes (Signed)
62 y.o. G0P0000 Married White or Caucasian Not Hispanic or Latino female here for annual exam.  She is having hot flashes and would like to talk about getting something to help with those. Her symptoms have worsened in the last 6 months, her cycles stopped in 1/21. Some weeks are worse than others, some weeks she will have 3-4 hot flashes a day. No significant night sweats, sometimes wakes up warm, it's tolerable. Mostly tolerable.  No vaginal bleeding. Sexually active, some dryness which is helped with lubricant.    FH of breast cancer. Negative BRCA testing. TC risk of breast cancer was estimated at 19.2% in 12/13.    The patient was seen in 3/21 with AUB and pyometra. Ultrasound without other findings, office curettage with drainage of abscess, treated with antibiotics.  Patient's last menstrual period was 12/09/2019 (approximate).          Sexually active: Yes.    The current method of family planning is post menopausal status.    Exercising: Yes.     Walking  Smoker:  no  Health Maintenance: Pap:   01/19/20 Negative, Hr HPV neg;  12-19-17 neg HPV HR neg  History of abnormal Pap:  no MMG:  09/06/22 Bi-Rads Cat 1: negative Breast MRI 04/06/23 Bi-rads 1 neg  BMD:   none  Colonoscopy: 11/13/22 follow up 5 years  TDaP:  06/18/19 Gardasil: n/a   reports that she has never smoked. She has been exposed to tobacco smoke. She has never used smokeless tobacco. She reports current alcohol use. She reports that she does not use drugs. Social ETOH. She teaches pre-K. She has a 28 year old son, just graduated from college.    Past Medical History:  Diagnosis Date   Allergy    Arthritis    left knee   Endometriosis    Infertility, female    IVF 12 years ago   Migraine headache    topamax for prevention at 75mg , imitrex nasal spray 20 mg/act- up to 2 per day q2 hours   Vitamin D deficiency    supplement through GYN    Past Surgical History:  Procedure Laterality Date   COLONOSCOPY     FOOT  SURGERY     both feet   HYDROCELE EXCISION  1988   hernia, right lower abd   LAPAROSCOPY     removed left ovary    Current Outpatient Medications  Medication Sig Dispense Refill   Acetaminophen (TYLENOL PO) Take by mouth as needed.     Cholecalciferol (VITAMIN D3 PO) Take 2,000 Int'l Units by mouth.      Cyanocobalamin (VITAMIN B-12 PO) Take by mouth.     Current Facility-Administered Medications  Medication Dose Route Frequency Provider Last Rate Last Admin   0.9 %  sodium chloride infusion  500 mL Intravenous Once Pyrtle, Carie Caddy, MD        Family History  Problem Relation Age of Onset   Breast cancer Mother 65       passed at 21   Prostate cancer Father 33       lived into 29s   Uterine cancer Sister 98       lives in [place]   Kidney cancer Sister 58   Depression Brother        suicide   Colon cancer Neg Hx    Colon polyps Neg Hx    Crohn's disease Neg Hx    Esophageal cancer Neg Hx    Rectal cancer Neg Hx  Stomach cancer Neg Hx    Ulcerative colitis Neg Hx     Review of Systems  All other systems reviewed and are negative.   Exam:   LMP 12/09/2019 (Approximate)   Weight change: @WEIGHTCHANGE @ Height:      Ht Readings from Last 3 Encounters:  11/13/22 5\' 4"  (1.626 m)  10/23/22 5\' 4"  (1.626 m)  03/22/22 5\' 4"  (1.626 m)    General appearance: alert, cooperative and appears stated age Head: Normocephalic, without obvious abnormality, atraumatic Neck: no adenopathy, supple, symmetrical, trachea midline and thyroid normal to inspection and palpation Lungs: clear to auscultation bilaterally Cardiovascular: regular rate and rhythm Breasts: normal appearance, no masses or tenderness Abdomen: soft, non-tender; non distended,  no masses,  no organomegaly Extremities: extremities normal, atraumatic, no cyanosis or edema Skin: Skin color, texture, turgor normal. No rashes or lesions Lymph nodes: Cervical, supraclavicular, and axillary nodes normal. No  abnormal inguinal nodes palpated Neurologic: Grossly normal   Pelvic: External genitalia:  no lesions              Urethra:  normal appearing urethra with no masses, tenderness or lesions              Bartholins and Skenes: normal                 Vagina: normal appearing vagina with normal color and discharge, no lesions              Cervix: no lesions               Bimanual Exam:  Uterus:  normal size, contour, position, consistency, mobility, non-tender              Adnexa: no mass, fullness, tenderness               Rectovaginal: Confirms               Anus:  normal sphincter tone, no lesions  Carolynn Serve, CMA chaperoned for the exam.  1. Well woman exam Discussed breast self exam Discussed calcium and vit D intake Mammogram and colonoscopy UTD Labs UTD No pap this year  2. Family history of breast cancer Breast imaging UTD  3. Hot flashes Mostly tolerable, discussed behavioral changes

## 2023-05-07 ENCOUNTER — Encounter: Payer: Self-pay | Admitting: Family Medicine

## 2023-05-07 ENCOUNTER — Ambulatory Visit: Payer: 59 | Admitting: Family Medicine

## 2023-05-07 VITALS — BP 128/82 | HR 67 | Temp 98.2°F | Ht 64.0 in | Wt 187.0 lb

## 2023-05-07 DIAGNOSIS — F439 Reaction to severe stress, unspecified: Secondary | ICD-10-CM

## 2023-05-07 DIAGNOSIS — E785 Hyperlipidemia, unspecified: Secondary | ICD-10-CM | POA: Diagnosis not present

## 2023-05-07 DIAGNOSIS — M6740 Ganglion, unspecified site: Secondary | ICD-10-CM

## 2023-05-07 DIAGNOSIS — E559 Vitamin D deficiency, unspecified: Secondary | ICD-10-CM | POA: Diagnosis not present

## 2023-05-07 LAB — CBC WITH DIFFERENTIAL/PLATELET
Basophils Absolute: 0 10*3/uL (ref 0.0–0.1)
Basophils Relative: 0.7 % (ref 0.0–3.0)
Eosinophils Absolute: 0.2 10*3/uL (ref 0.0–0.7)
Eosinophils Relative: 4 % (ref 0.0–5.0)
HCT: 43.3 % (ref 36.0–46.0)
Hemoglobin: 15 g/dL (ref 12.0–15.0)
Lymphocytes Relative: 35.3 % (ref 12.0–46.0)
Lymphs Abs: 1.7 10*3/uL (ref 0.7–4.0)
MCHC: 34.6 g/dL (ref 30.0–36.0)
MCV: 93.8 fl (ref 78.0–100.0)
Monocytes Absolute: 0.4 10*3/uL (ref 0.1–1.0)
Monocytes Relative: 9 % (ref 3.0–12.0)
Neutro Abs: 2.4 10*3/uL (ref 1.4–7.7)
Neutrophils Relative %: 51 % (ref 43.0–77.0)
Platelets: 225 10*3/uL (ref 150.0–400.0)
RBC: 4.61 Mil/uL (ref 3.87–5.11)
RDW: 13.3 % (ref 11.5–15.5)
WBC: 4.7 10*3/uL (ref 4.0–10.5)

## 2023-05-07 LAB — COMPREHENSIVE METABOLIC PANEL
ALT: 15 U/L (ref 0–35)
AST: 16 U/L (ref 0–37)
Albumin: 4.6 g/dL (ref 3.5–5.2)
Alkaline Phosphatase: 85 U/L (ref 39–117)
BUN: 20 mg/dL (ref 6–23)
CO2: 27 mEq/L (ref 19–32)
Calcium: 9.5 mg/dL (ref 8.4–10.5)
Chloride: 105 mEq/L (ref 96–112)
Creatinine, Ser: 0.82 mg/dL (ref 0.40–1.20)
GFR: 77.98 mL/min (ref >60.00)
Glucose, Bld: 84 mg/dL (ref 70–99)
Potassium: 4.3 mEq/L (ref 3.5–5.1)
Sodium: 140 mEq/L (ref 135–145)
Total Bilirubin: 0.5 mg/dL (ref 0.2–1.2)
Total Protein: 7 g/dL (ref 6.0–8.3)

## 2023-05-07 LAB — LIPID PANEL
Cholesterol: 230 mg/dL — ABNORMAL HIGH (ref 0–200)
HDL: 65.7 mg/dL (ref >39.00)
LDL Cholesterol: 134 mg/dL — ABNORMAL HIGH (ref 0–99)
NonHDL: 163.98
Total CHOL/HDL Ratio: 3
Triglycerides: 151 mg/dL — ABNORMAL HIGH (ref 0.0–149.0)
VLDL: 30.2 mg/dL (ref 0.0–40.0)

## 2023-05-07 LAB — TSH: TSH: 1.66 u[IU]/mL (ref 0.35–5.50)

## 2023-05-07 LAB — VITAMIN D 25 HYDROXY (VIT D DEFICIENCY, FRACTURES): VITD: 65.35 ng/mL (ref 30.00–100.00)

## 2023-05-07 NOTE — Patient Instructions (Addendum)
ongoing stress with sparing spikes in anxiety possible mild panic attacks- - discussed therapy as an option to be proactive- as she honestly is managing pretty well -we do not think she's at a point where she needs medicine for this - keep doing great job with exercise, engaging in community  Please call [phone] to schedule a visit with [facility] - please tell the office you were directly referred by Dr. Durene Cal (we have Dr. Monna Fam in our office if interested)   Corinda [facility] sometimes can be backed up for up to 2 months so here are some other options to check in on: - [email or web address]  at  [phone] [facility] [email or web address] at [phone]  We have placed a referral for you today to sports medicine. In some cases you will see # listed below- you can call this if you have not heard within a week. If you do not see # listed- you should receive a mychart message or phone call within a week with the # to call. Reach out to [place] if you ar enot scheduled within 2 weeks   Please stop by lab before you go If you have mychart- we will send your results within 3 business days of [place] receiving them.  If you do not have mychart- we will call you about results within 5 business days of [place] receiving them.  *please also note that you will see labs on mychart as soon as they post. I will later go in and write notes on them- will say "notes from Dr. Durene Cal"   Recommended follow up: Return in about 1 year (around 05/06/2024) for physical or sooner if needed.Schedule b4 you leave.

## 2023-05-07 NOTE — Progress Notes (Signed)
Phone [phone] In person visit   Subjective:   Lisa Rice is a 62 y.o. year old very pleasant female patient who presents for/with See problem oriented charting Chief Complaint  Patient presents with   Cyst    On finger Rt 3rd finger    Anxiety    Past Medical History-  Patient Active Problem List   Diagnosis Date Noted   Mild hyperlipidemia 03/11/2019    Priority: Medium    Migraine headache     Priority: Medium    Vitamin D deficiency     Priority: Low   ALLERGIC RHINITIS 08/09/2007    Priority: Low    Medications- reviewed and updated Current Outpatient Medications  Medication Sig Dispense Refill   Acetaminophen (TYLENOL PO) Take by mouth as needed.     Cholecalciferol (VITAMIN D3 PO) Take 2,000 Int'l Units by mouth.      Cyanocobalamin (VITAMIN B-12 PO) Take by mouth.     No current facility-administered medications for this visit.     Objective:  BP 128/82   Pulse 67   Temp 98.2 F (36.8 C) (Temporal)   Ht 5\' 4"  (1.626 m)   Wt 187 lb (84.8 kg)   LMP 12/09/2019 (Approximate)   SpO2 100%   BMI 32.10 kg/m  Gen: NAD, resting comfortably No thyrogmegaly CV: RRR no murmurs rubs or gallops Lungs: CTAB no crackles, wheeze, rhonchi Ext: trace  edema Skin: warm, dry     Assessment and Plan   #Social update- may fly to [place] next month to see bro in law  # Ganglion cyst S:distal interphalangeal R hand- 3rd finger. Last year had similar lesion on right firth aspirated and injected by Dr. Denyse Amass and that has done well. Not much pain but affecting the nail fold - had referred to sports medicine last visit more for her back  (referred to physical therapy )and knee (thought possibly radiating from lumbar spine- advesd physical therapy and Voltaren) - these issues are better. A/P: new ganglion cyst- refer back to Dr. Denyse Amass for consideration of aspiration and injectoin given prior good response on different lesion.    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 A/P: ongoing stress with sparing spikes in anxiety possible mild panic attacks- - discussed therapy as an option to be proactive- as she honestly is managing pretty well -we do not think she's at a point where she needs medicine for this - keep doing great job with exercise, engaging in community  #hyperlipidemia- no family history coronary artery disease  S: Medication:none  Lab Results  Component Value Date   CHOL 169 01/19/2020   HDL 39 (L) 01/19/2020   LDLCALC 100 (H) 01/19/2020   TRIG 172 (H) 01/19/2020   CHOLHDL 4.3 01/19/2020   A/P: hopefully stable or improved- update levels and recalculate arteriosclerotic cardiovascular disease risk   #Vitamin  D deficiency S: Medication: 2000 units a day right now Last vitamin D Lab Results  Component Value Date   VD25OH 23.4 (L) 01/19/2020  A/P: hopefully improved /controlled now- has been very regular- update levels today   Recommended follow up: Return in about 1 year (around 05/06/2024) for physical or sooner if needed.Schedule b4 you leave. Future Appointments  Date Time Provider Department Center  05/08/2023  8:00 AM Romualdo Bolk, MD [facility] None    Lab/Order associations:   ICD-10-CM   1. Mild hyperlipidemia  E78.5 CBC with Differential/Platelet    Comprehensive metabolic panel    Lipid  panel    TSH    2. Vitamin D deficiency  E55.9 VITAMIN D 25 Hydroxy (Vit-D Deficiency, Fractures)    3. Stress  F43.9 Ambulatory referral to Psychology    4. Ganglion cyst  M67.40 Ambulatory referral to Sports Medicine    CANCELED: Ambulatory referral to Sports Medicine     Time Spent: 31 minutes of total time (8:30 AM- 9:01 AM) was spent on the date of the encounter performing the following actions: chart review prior to seeing the patient, obtaining history, performing a medically necessary exam, counseling on the treatment plan and options including therapy, placing orders, and documenting in our EHR.   Return precautions advised.  Tana Conch, MD

## 2023-05-08 ENCOUNTER — Other Ambulatory Visit: Payer: Self-pay

## 2023-05-08 ENCOUNTER — Ambulatory Visit: Payer: 59 | Admitting: Family Medicine

## 2023-05-08 ENCOUNTER — Encounter: Payer: Self-pay | Admitting: Obstetrics and Gynecology

## 2023-05-08 ENCOUNTER — Ambulatory Visit (INDEPENDENT_AMBULATORY_CARE_PROVIDER_SITE_OTHER): Payer: 59 | Admitting: Obstetrics and Gynecology

## 2023-05-08 VITALS — BP 122/78 | HR 71 | Ht 64.0 in | Wt 188.0 lb

## 2023-05-08 VITALS — BP 120/74 | HR 69 | Ht 64.0 in | Wt 186.0 lb

## 2023-05-08 DIAGNOSIS — M67441 Ganglion, right hand: Secondary | ICD-10-CM | POA: Diagnosis not present

## 2023-05-08 DIAGNOSIS — R232 Flushing: Secondary | ICD-10-CM | POA: Diagnosis not present

## 2023-05-08 DIAGNOSIS — Z803 Family history of malignant neoplasm of breast: Secondary | ICD-10-CM

## 2023-05-08 DIAGNOSIS — Z01419 Encounter for gynecological examination (general) (routine) without abnormal findings: Secondary | ICD-10-CM | POA: Diagnosis not present

## 2023-05-08 MED ORDER — METHYLPREDNISOLONE ACETATE 80 MG/ML IJ SUSP
80.0000 mg | Freq: Once | INTRAMUSCULAR | Status: AC
  Administered 2023-05-08: 80 mg via INTRALESIONAL

## 2023-05-08 NOTE — Progress Notes (Signed)
   Rubin Payor, PhD, LAT, ATC acting as a scribe for Clementeen Graham, MD.  Lisa Rice is a 62 y.o. female who presents to [facility] at [facility] today for right third finger pain.  Patient was last seen by Dr. Denyse Amass on 12/28/2021 and the cyst on her R 5th finger was decompressed and injected with Depo Medrol and lidocaine. Today, pt reports R 3rd finger cyst appeared around the end of April. She locates pain to the distal part of her R 3rd finger and the cuticle and nail bed are starting to become deformed. She also notes that there appear to be black spots within the cyst.    Pertinent review of systems: No fevers or chills  Relevant historical information: No personal rheumatologic history.   Exam:  BP 122/78   Pulse 71   Ht 5\' 4"  (1.626 m)   Wt 188 lb (85.3 kg)   LMP 12/09/2019 (Approximate)   SpO2 97%   BMI 32.27 kg/m  General: Well Developed, well nourished, and in no acute distress.   MSK: Right third digit large clear cystic structure present in the dorsal distal finger just at the base of the nail.  Nontender normal finger motion.    Lab and Radiology Results  Ganglion cyst aspiration and injection procedure right third digit dorsal finger Consent obtained and timeout performed. Skin sterilized with isopropyl alcohol. Cold spray applied and 0.5 mL of lidocaine were injected into and around the ganglion cyst. Skin was again sterilized with isopropyl alcohol and an 18-gauge needle was used to puncture the cyst. Clear gelatinous material was expressed from the cyst until it was decompressed. Skin was again sterilized with isopropyl alcohol and a 25-gauge needle was used to inject into and around the cystic structure 0.5 mL of 80 mg/mL Depo-Medrol solution and 0.5 mL of lidocaine. Patient tolerated procedure well.     Assessment and Plan: 62 y.o. female with ganglion cyst dorsal distal third digit right hand.  Status post aspiration and injection  today.   PDMP not reviewed this encounter. Orders Placed This Encounter  Procedures   [place] LIMITED JOINT SPACE STRUCTURES UP RIGHT(NO LINKED CHARGES)    Order Specific Question:   Reason for Exam (SYMPTOM  OR DIAGNOSIS REQUIRED)    Answer:   right hand pain    Order Specific Question:   Preferred imaging location?    Answer:   [facility] ordered this encounter  Medications   methylPREDNISolone acetate (DEPO-MEDROL) injection 80 mg     Discussed warning signs or symptoms. Please see discharge instructions. Patient expresses understanding.   The above documentation has been reviewed and is accurate and complete Clementeen Graham, M.D.

## 2023-05-08 NOTE — Patient Instructions (Signed)

## 2023-05-08 NOTE — Patient Instructions (Signed)
Thank you for coming in today.   You received an injection today. Seek immediate medical attention if the joint becomes red, extremely painful, or is oozing fluid.   Check back as needed 

## 2023-05-21 ENCOUNTER — Ambulatory Visit (INDEPENDENT_AMBULATORY_CARE_PROVIDER_SITE_OTHER): Payer: 59 | Admitting: Licensed Clinical Social Worker

## 2023-05-21 ENCOUNTER — Encounter: Payer: Self-pay | Admitting: Licensed Clinical Social Worker

## 2023-05-21 DIAGNOSIS — F411 Generalized anxiety disorder: Secondary | ICD-10-CM | POA: Diagnosis not present

## 2023-05-21 NOTE — Progress Notes (Signed)
[facility] Counselor/Therapist Progress Note  Patient ID: Lisa Rice, MRN: [id number]    Date: 05/21/23  Time Spent: 1000 am - 1100 am : 60 Minutes  Treatment Type: ADULT ASSESSMENT Reported Symptoms: Anxiety and Depression  Presenting Problem Chief Complaint: Feeling overwhelmed at times, concerned that it may be menopausal per PT report. Feeling unable to find place in life at this stage per patient report.  What are the main stressors in your life right now, how long? Panic attacks, Anxiety, depression   Previous mental health services Have you ever been treated for a mental health problem, when, where, by whom? No  PT DENIED   Are you currently seeing a therapist or counselor, counselor's name? No PT DENIED  Have you ever had a mental health hospitalization, how many times, length of stay? No PT DENIED  Have you ever been treated with medication, name, reason, response? No   Have you ever had suicidal thoughts or attempted suicide, when, how? No   Risk factors for Suicide Demographic factors:  Caucasian Current mental status: No plan to harm self or others Loss factors: Loss of significant relationship Historical factors: Family history of suicide Risk Reduction factors: Religious beliefs about death Clinical factors:  Severe Anxiety and/or Agitation Depression Cognitive features that contribute to risk: NA    SUICIDE RISK:  Minimal: No identifiable suicidal ideation.  Patients presenting with no risk factors but with morbid ruminations; may be classified as minimal risk based on the severity of the depressive symptoms  Medical history Medical treatment and/or problems, explain: No  Do you have any issues with chronic pain?  No  Name of primary care physician/last physical exam: DR> HUNTER  Allergies: No Medication, reactions?    Current medications: REFER TO CHART Prescribed by: DR>HUNTER Is there any history of mental health problems or substance  abuse in your family, whom? Yes Brother was a [place] Veteran and he committed suicide in his 4's. Has anyone in your family been hospitalized, who, where, length of stay? No   Social/family history Have you been married, how many times?  01  Do you have children?  01  How many pregnancies have you had?  0  Who lives in your current household? 3, Self, husband and son (adopted at age 19 month)  Military history: No   Religious/spiritual involvement: [employer] What religion/faith base are you? Catholic  Family of origin (childhood history) PT was one of 14 children. She reports that he mother passed when PT was 69 years old of cancer. Father remarried to step-mother who struggled to take on so many children. PT reports that step-mother was not very loving and likely struggled with her own issues. Both parents are now deceases and PT reports that the siblings are all over the [place] and have trouble getting together due to life events.  Where were you born? [place] Where did you grow up? [place] How many different homes have you lived? 2 Describe the atmosphere of the household where you grew up: PT reports that there wasn't abuse in the home but there were basic challenges with so many children and the family dynamic of a blended family.  Do you have siblings, step/half siblings, list names, relation, sex, age? Yes PT has 9 full siblings and 1 half sibling and 3 step siblings.   Are your parents separated/divorced, when and why? No   Are your parents alive? No   Social supports (personal and professional): PT reports her husband as  a strong support and her son as well as he friends from church and sister.   Education How many grades have you completed? high school diploma/GED and a bachelors degree in teaching Did you have any problems in school, what type? No  Medications prescribed for these problems? 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    Legal history PT denied any previous or current legal issues  Trauma/Abuse history: Have you ever been exposed to any form of abuse, what type? No     Have you ever been exposed to something traumatic, describe? No   Substance use Do you use Caffeine? Yes Type, frequency? 2 cups daily  Do you use Nicotine? No Type, frequency, ppd?    Do you use Alcohol? Yes Type, frequency? Wine on occassion  How old were you went you first tasted alcohol? Teenager Was this accepted by your family? Yes  When was your last drink, type, how much? Friday , glass of wine  Have you ever used illicit drugs or taken more than prescribed, type, frequency, date of last usage? No   Mental Status:ORIENTED TO PERSON, PLACE, TIME, SITUATION (X4) General Appearance Lisa Rice:  Neat Eye Contact:  Good Motor Behavior:  Normal Speech:  Normal Level of Consciousness:  Alert Mood:  NA Affect:  Appropriate Anxiety Level:  Minimal Thought Process:  Coherent Thought Content:  WNL Perception:  Normal Judgment:  Good Insight:  Present Cognition:  Orientation time, place, and person  Diagnosis AXIS I Anxiety Disorder NOS and Depressive Disorder NOS,   AXIS II   AXIS III Past Medical History:  Diagnosis Date   Allergy    Arthritis    left knee   Endometriosis    Infertility, female    IVF 12 years ago   Migraine headache    topamax for prevention at 75mg , imitrex nasal spray 20 mg/act- up to 2 per day q2 hours   Vitamin D deficiency    supplement through GYN    AXIS IV other psychosocial or environmental problems  AXIS V 61-70 mild symptoms   Plan: Plan: Patient is to use CBT, mindfulness and coping skills to help manage decrease symptoms associated with their diagnosis.   Long-term goal:   Reduce overall level, frequency, and intensity of the feelings of depression, anxiety and panic evidenced by       decreased irritability,  negative self talk, and helpless feelings from 6 to 7 days/week to 0 to 1 days/week per client report for at least 3 consecutive months.  Short-term goal:  Verbally express understanding of the relationship between feelings of depression, anxiety and their impact on thinking patterns and behaviors. Verbalize an understanding of the role that distorted thinking plays in creating fears, excessive worry, and ruminations.   Lisa Rice MSW, LCSW  _________________________________________           Lisa Rice MSW, LCSW/ Date 05/21/2023

## 2023-06-04 ENCOUNTER — Ambulatory Visit: Payer: 59 | Admitting: Licensed Clinical Social Worker

## 2023-06-04 DIAGNOSIS — F411 Generalized anxiety disorder: Secondary | ICD-10-CM | POA: Diagnosis not present

## 2023-06-04 NOTE — Progress Notes (Signed)
[facility] Counselor/Therapist Progress Note  Patient ID: Lisa Rice, MRN: [id number]    Date: 06/04/23  Time Spent: 0115 pm - 0200 pm :   45 Minutes  Treatment Type: Individual Therapy.  Reported Symptoms: Symptoms of depression and anxiety related to childhood  Mental Status Exam: Appearance:  Well Groomed     Behavior: Appropriate  Motor: Normal  Speech/Language:  Clear and Coherent  Affect: Appropriate  Mood: normal  Thought process: normal  Thought content:   WNL  Sensory/Perceptual disturbances:   WNL  Orientation: oriented to person, place, time/date, situation, day of week, month of year, and year  Attention: Good  Concentration: Good  Memory: WNL  Fund of knowledge:  Good  Insight:   Good  Judgment:  Good  Impulse Control: Good   Risk Assessment: Danger to Self:  No Self-injurious Behavior: No Danger to Others: No Duty to Warn:no Physical Aggression / Violence:No  Access to Firearms a concern: No  Gang Involvement:No   Subjective:   Lisa Rice participated in the session, in person in the office with the therapist, and consented to treatment. PT was dressed appropriately and was neat and clean in appearance. PT was polite and cooperative and engaged in discussion.      Interventions: Cognitive Behavioral Therapy and Family Systems  Diagnosis:  Generalized Anxiety Disorder  Plan: Lisa Rice  is to use CBT, mindfulness and coping skills to help manage decrease symptoms associated with their diagnosis.   Long-term goal:   Lisa Rice will Reduce overall level, frequency, and intensity of the feelings of depression, anxiety as evidenced by decreased irritability, negative self talk, and helpless feelings from 6 to 7 days/week to 0 to 2 days/week per client report for at least 3 consecutive months.  Short-term goal:  Lisa Rice will Verbally express understanding of the relationship between feelings of depression, anxiety and their impact on thinking  patterns and behaviors. Verbalize an understanding of the role that distorted thinking plays in creating fears, excessive worry, and ruminations.  Lisa Rice MSW, LCSW Date 06/04/2023

## 2023-06-18 ENCOUNTER — Ambulatory Visit: Payer: 59 | Admitting: Licensed Clinical Social Worker

## 2023-06-18 ENCOUNTER — Ambulatory Visit (INDEPENDENT_AMBULATORY_CARE_PROVIDER_SITE_OTHER): Payer: 59 | Admitting: Licensed Clinical Social Worker

## 2023-06-18 DIAGNOSIS — F411 Generalized anxiety disorder: Secondary | ICD-10-CM | POA: Diagnosis not present

## 2023-06-18 NOTE — Progress Notes (Signed)
[facility] Counselor/Therapist Progress Note  Patient ID: THETA LEAF, MRN: [id number]    Date: 06/18/23  Time Spent: 0900  am - 1000 am : 60 Minutes  Treatment Type: Individual Therapy.  Reported Symptoms: Symptom of anxiety and depression  Mental Status Exam: Appearance:  Casual     Behavior: Appropriate  Motor: Normal  Speech/Language:  Clear and Coherent  Affect: Appropriate  Mood: normal  Thought process: normal  Thought content:   WNL  Sensory/Perceptual disturbances:   WNL  Orientation: oriented to person, place, time/date, situation, day of week, month of year, and year  Attention: Good  Concentration: Good  Memory: WNL  Fund of knowledge:  Good  Insight:   Good  Judgment:  Good  Impulse Control: Good   Risk Assessment: Danger to Self:  No Self-injurious Behavior: No Danger to Others: No Duty to Warn:no Physical Aggression / Violence:No  Access to Firearms a concern: No  Gang Involvement:No   Subjective:   Lisa Rice participated from office, with Clinician present and consented to treatment.   Patient presented on time and participated in discussion. Patient identified her desire to improve her relationship with her siblings an identified a planned trip that she and another sister are setting up to get everyone together. Clinician provided a worksheet identifying family roles. Patient was able to quickly identify her role as well as other roles of her siblings in her home while growing up.  This helped identify that each sibling was going through their own stuff and they developed various ways to cope.   Interventions: Cognitive Behavioral Therapy  Diagnosis: No diagnosis found.   Plan: Lisa Rice is to use CBT, mindfulness and coping skills to help manage decrease symptoms associated with their diagnosis.   Long-term goal:   Lisa Rice will reduce overall level, frequency, and intensity of the feelings of depression, anxiety and panic evidenced  by decreased irritability, negative self talk, worry and helpless feelings from 6 to 7 days/week to 0 to 2 days/week per client report for at least 3 consecutive months.Treatment plan will be reviewed no later than 06/19/2024.  Short-term goal:  Lisa Rice will verbally express understanding of the relationship between feelings of depression, anxiety and their impact on thinking patterns and behaviors. Verbalize an understanding of the role that distorted thinking plays in creating fears, excessive worry, and ruminations.  Lisa Rice MSW, LCSW Date: 06/18/2023

## 2023-07-02 ENCOUNTER — Ambulatory Visit: Payer: 59 | Admitting: Licensed Clinical Social Worker

## 2023-07-02 ENCOUNTER — Encounter: Payer: Self-pay | Admitting: Licensed Clinical Social Worker

## 2023-07-02 DIAGNOSIS — F411 Generalized anxiety disorder: Secondary | ICD-10-CM

## 2023-07-02 NOTE — Progress Notes (Signed)
[facility] Counselor/Therapist Progress Note  Patient ID: Lisa Rice, MRN: [id number]    Date: 07/02/23  Time Spent: 0900  am - 1000 am : 60 Minutes  Treatment Type: Individual Therapy.  Reported Symptoms: Patient reports symptoms of anxiety, racing thoughts and difficulty making decisions.  Mental Status Exam: Appearance:  Casual     Behavior: Appropriate  Motor: Normal  Speech/Language:  Clear and Coherent  Affect: Appropriate  Mood: normal  Thought process: normal  Thought content:   WNL  Sensory/Perceptual disturbances:   WNL  Orientation: oriented to person, place, time/date, situation, day of week, month of year, and year  Attention: Good  Concentration: Good  Memory: WNL  Fund of knowledge:  Good  Insight:   Good  Judgment:  Good  Impulse Control: Good   Risk Assessment: Danger to Self:  No Self-injurious Behavior: No Danger to Others: No Duty to Warn:no Physical Aggression / Violence:No  Access to Firearms a concern: No  Gang Involvement:No   Subjective:   Lisa Rice participated from office located at [facility] with Clinician present. Lisa Rice consented to treatment.   Patient presented on time for her scheduled session. Patient reviewed the family rolls worksheet and was able to identify her role and how that has followed her throughout life. Lisa Rice identified how she has trouble making decisions and often needs validation before making a decision.   Clinician and patient discussed how often the roles we play in our family as children can also play out in our adult lives. Patient was encouraged to begin making small decisions without feeling the need to collaborate with others.   Interventions: Cognitive Behavioral Therapy and Family Systems  Diagnosis: Generalized anxiety disorder   Plan: Lisa Rice  is to use CBT, mindfulness and coping skills to help manage decrease symptoms associated with their diagnosis.   Long-term goal:    Lisa Rice will reduce overall level, frequency, and intensity of the feelings of depression and anxiety  evidenced by decreased irritability, negative self talk, and helpless feelings from 6 to 7 days/week to 0 to 2 days/week per client report for at least 3 consecutive months.Treatment plan will be reviewed by 05/20/2024.  Short-term goal:  Lisa Rice will verbally express understanding of the relationship between feelings of depression, anxiety and their impact on thinking patterns and behaviors. Verbalize an understanding of the role that distorted thinking plays in creating fears, excessive worry, and ruminations.  Lisa Rice MSW, LCSW DATE:07/02/2023

## 2023-07-17 ENCOUNTER — Ambulatory Visit: Payer: 59 | Admitting: Licensed Clinical Social Worker

## 2023-08-02 ENCOUNTER — Ambulatory Visit: Payer: 59 | Admitting: Licensed Clinical Social Worker

## 2023-08-02 DIAGNOSIS — F411 Generalized anxiety disorder: Secondary | ICD-10-CM

## 2023-08-03 NOTE — Progress Notes (Signed)
[facility] Counselor/Therapist Progress Note  Patient ID: Lisa Rice, MRN: [id number]    Date: 08/02/2023  Time Spent: 408  pm - 0502 pm : 54 Minutes  Treatment Type: Individual Therapy.  Reported Symptoms: Anxiety  Mental Status Exam: Appearance:  Well Groomed     Behavior: Appropriate  Motor: Normal  Speech/Language:  Clear and Coherent  Affect: Appropriate  Mood: normal  Thought process: normal  Thought content:   WNL  Sensory/Perceptual disturbances:   WNL  Orientation: oriented to person, place, time/date, situation, day of week, month of year, and year  Attention: Good  Concentration: Good  Memory: WNL  Fund of knowledge:  Good  Insight:   Good  Judgment:  Good  Impulse Control: Good   Risk Assessment: Danger to Self:  No Self-injurious Behavior: No Danger to Others: No Duty to Warn:no Physical Aggression / Violence:No  Access to Firearms a concern: No  Gang Involvement:No   Subjective:   [name] participated from office located at [facility] with Clinician present. [name] consented to treatment.   [name] presented for her session in a positive mood. Patient reports being excited to be back in school and working. She reports that the summer was a good break but its good to be back in the routine. Patient shared that she is also excited that she and several of her siblings are going to another siblings child's wedding out of town. Patient identified that she is looking forward to the time with her siblings to catch up. Patient reports that she is concerned for her son finding a job. She states that she hoping he finds something soon and can move forward with his future. Patient identified that as a mother she likely worries more than she should. Clinician and patient identified healthy ways to address the issue such as assisting in employment ideas and providing advice and encouragement.   DIAGNOSIS: Generalized Anxiety Disorder  [name]  is to use CBT, mindfulness and coping skills to help manage decrease symptoms associated with their diagnosis.   Long-term goal:   [name] will reduce overall level, frequency, and intensity of the feelings of anxiety evidenced by decreased irritability, negative self talk, and helpless feelings from 6 to 7 days/week to 0 to 2 days/week per client report for at least 3 consecutive months. Treatment plan to be reviewed by 05/20/2024  Short-term goal:  [name] will verbally express understanding of the relationship between feelings of depression, anxiety and their impact on thinking patterns and behaviors. Verbalize an understanding of the role that distorted thinking plays in creating fears, excessive worry, and ruminations.  [name] MSW, LCSW DATE: 08/02/2023

## 2023-08-27 ENCOUNTER — Ambulatory Visit (INDEPENDENT_AMBULATORY_CARE_PROVIDER_SITE_OTHER): Payer: 59 | Admitting: Licensed Clinical Social Worker

## 2023-08-27 DIAGNOSIS — F411 Generalized anxiety disorder: Secondary | ICD-10-CM | POA: Diagnosis not present

## 2023-08-28 NOTE — Progress Notes (Signed)
 Behavioral Health Counselor/Therapist Progress Note  Patient ID: PATT STEINHARDT, MRN: [id number]    Date: 08/27/2023  Time Spent: 405  pm - 500 pm : 55 Minutes  Treatment Type: Individual Therapy.  Reported Symptoms: Symptoms of anxiety  Mental Status Exam: Appearance:  Neat     Behavior: Appropriate  Motor: Normal  Speech/Language:  Clear and Coherent  Affect: Appropriate  Mood: normal  Thought process: normal  Thought content:   WNL  Sensory/Perceptual disturbances:   WNL  Orientation: oriented to person, place, time/date, situation, day of week, month of year, and year  Attention: Good  Concentration: Good  Memory: WNL  Fund of knowledge:  Good  Insight:   Good  Judgment:  Good  Impulse Control: Good   Risk Assessment: Danger to Self:  No Self-injurious Behavior: No Danger to Others: No Duty to Warn:no Physical Aggression / Violence:No  Access to Firearms a concern: No  Gang Involvement:No   Subjective:   [name] participated from office located at [facility] with Clinician present. Byrdie consented to treatment.    Stacy presented for her session in a positive mood. Darlean was engaged during session and reported that she is doing well and that work is going well. Patient reports that she is almost dreading the upcoming trip now to see family. Patient reports that often she does what others expect of her as to not wanting to disappoint anyone. Patient states that as she has got older she desires to be at home more and not be on the go. She reports that saying no is an issue and she wants to do more of what makes her happy and comfortable. Clinician processed with patient that it's okay to say no and ist okay to look out for yourself and your needs and desires. Clinician reminded patient that you can't pour from an empty cup and taking care of oneself first is a must.   Interventions: Cognitive Behavioral Therapy and  Assertiveness/Communication  Diagnosis: Generalized Anxiety Disorder   Plan: Sherylann  is to use CBT, mindfulness and coping skills to help manage decrease symptoms associated with their diagnosis.   Long-term goal:   Khia will  overall level, frequency, and intensity of the feelings of depression, anxiety and panic evidenced by decreased irritability, negative self talk, and helpless feelings from 6 to 7 days/week to 0 to 2 days/week per client report for at least 3 consecutive months. Treatment plan to be reviewed by 05/20/2024.  Short-term goal:  Sotiria will verbally express understanding of the relationship between feelings of depression, anxiety and their impact on thinking patterns and behaviors. Verbalize an understanding of the role that distorted thinking plays in creating fears, excessive worry, and ruminations.  [name] MSW, LCSW DATE: 08/27/2023

## 2023-09-24 ENCOUNTER — Ambulatory Visit: Payer: 59 | Admitting: Licensed Clinical Social Worker

## 2024-03-20 ENCOUNTER — Ambulatory Visit: Payer: Self-pay | Admitting: Family Medicine

## 2024-05-08 ENCOUNTER — Encounter: Payer: Self-pay | Admitting: Obstetrics and Gynecology

## 2024-05-08 ENCOUNTER — Telehealth: Payer: Self-pay

## 2024-05-08 ENCOUNTER — Ambulatory Visit (INDEPENDENT_AMBULATORY_CARE_PROVIDER_SITE_OTHER): Admitting: Obstetrics and Gynecology

## 2024-05-08 VITALS — BP 110/78 | HR 60 | Temp 97.9°F | Ht 65.25 in | Wt 197.0 lb

## 2024-05-08 DIAGNOSIS — L900 Lichen sclerosus et atrophicus: Secondary | ICD-10-CM | POA: Diagnosis not present

## 2024-05-08 DIAGNOSIS — Z01419 Encounter for gynecological examination (general) (routine) without abnormal findings: Secondary | ICD-10-CM | POA: Diagnosis not present

## 2024-05-08 DIAGNOSIS — Z1331 Encounter for screening for depression: Secondary | ICD-10-CM | POA: Diagnosis not present

## 2024-05-08 DIAGNOSIS — N951 Menopausal and female climacteric states: Secondary | ICD-10-CM | POA: Insufficient documentation

## 2024-05-08 DIAGNOSIS — Z803 Family history of malignant neoplasm of breast: Secondary | ICD-10-CM | POA: Insufficient documentation

## 2024-05-08 DIAGNOSIS — R232 Flushing: Secondary | ICD-10-CM | POA: Insufficient documentation

## 2024-05-08 LAB — COMPREHENSIVE METABOLIC PANEL WITH GFR
AG Ratio: 1.8 (calc) (ref 1.0–2.5)
ALT: 15 U/L (ref 6–29)
AST: 14 U/L (ref 10–35)
Albumin: 4.7 g/dL (ref 3.6–5.1)
Alkaline phosphatase (APISO): 81 U/L (ref 37–153)
BUN: 18 mg/dL (ref 7–25)
CO2: 27 mmol/L (ref 20–32)
Calcium: 9.6 mg/dL (ref 8.6–10.4)
Chloride: 104 mmol/L (ref 98–110)
Creat: 0.88 mg/dL (ref 0.50–1.05)
Globulin: 2.6 g/dL (ref 1.9–3.7)
Glucose, Bld: 89 mg/dL (ref 65–99)
Potassium: 4.6 mmol/L (ref 3.5–5.3)
Sodium: 139 mmol/L (ref 135–146)
Total Bilirubin: 0.5 mg/dL (ref 0.2–1.2)
Total Protein: 7.3 g/dL (ref 6.1–8.1)
eGFR: 75 mL/min/{1.73_m2} (ref >=60)

## 2024-05-08 MED ORDER — CLOBETASOL PROPIONATE 0.05 % EX OINT
1.0000 | TOPICAL_OINTMENT | Freq: Every day | CUTANEOUS | 6 refills | Status: AC
Start: 2024-05-08 — End: 2024-08-06

## 2024-05-08 MED ORDER — VEOZAH 45 MG PO TABS
1.0000 | ORAL_TABLET | Freq: Every day | ORAL | 3 refills | Status: AC
Start: 2024-05-08 — End: ?

## 2024-05-08 NOTE — Assessment & Plan Note (Signed)
 Discussed nonhormonal options for VMS, including SSRIs, veozah, gabapetin. SSRIs can have side effects such as hypotension, mood irritability, GI upset, weight gain, sexual dysfunction, and drowsiness. Suicidal ideation can happpen in a subset of patient, and we reviewed that the medication would need to be stopped for this reason. Veozah is contraindicated in patient with liver dysfunction and required q3 months CMP side effects include abdominal pain, diarrhea, back pain, and insomnia. Gabapentin side effects such as dizziness, drowsiness, mood irritability/lability, GI upset, and rash. Suicidal ideation can happpen in a subset of patient, and we reviewed that the medication would need to be stopped for this reason. Patient elects for veozah. Will plan for gabapentin if not covered.

## 2024-05-08 NOTE — Assessment & Plan Note (Signed)
 Cervical cancer screening performed according to ASCCP guidelines. Encouraged annual mammogram screening Colonoscopy UTD DXA N/A Labs and immunizations with her primary Encouraged safe sexual practices as indicated Encouraged healthy lifestyle practices with diet and exercise For patients under 50-61yo, I recommend 1200mg  calcium daily and 600IU of vitamin D daily.

## 2024-05-08 NOTE — Assessment & Plan Note (Addendum)
 Asymptomatic Reviewed with patient that lichen sclerosus is a chronic inflammatory condition of the skin that carries an increased risk of squamous cell carcinoma. Initial treatment is with chronic topical steroid.  Recommend daily clobetasol for 3 months, followed by reassessment of the genitourinary area. Return office in 3 months.

## 2024-05-08 NOTE — Progress Notes (Signed)
 63 y.o. G0P0000 postmenopausal female here for annual exam. Married. Teaches pre-K . Adopted son, 23yo, living with them. LMP: 1/21   She reports hot flashes. Open to options. Intense but brief during day and night. Feels they're worsening. FH of breast cancer. Negative BRCA testing. TC risk of breast cancer was estimated at 19.2% in 12/13.   Postmenopausal bleeding: none Pelvic discharge or pain: none Breast mass, nipple discharge or skin changes : none Sexually active: yes  History of abnormal Pap:  no  Last PAP:     Component Value Date/Time   DIAGPAP  01/19/2020 1424    - Negative for intraepithelial lesion or malignancy (NILM)   DIAGPAP  12/19/2017 0000    NEGATIVE FOR INTRAEPITHELIAL LESIONS OR MALIGNANCY.   HPVHIGH Negative 01/19/2020 1424   ADEQPAP  01/19/2020 1424    Satisfactory but limited for evaluation with partially obscuring   ADEQPAP  01/19/2020 1424    inflammation; no transformation zone component present.   ADEQPAP  12/19/2017 0000    Satisfactory for evaluation  endocervical/transformation zone component PRESENT.   Last mammogram: 09/06/22 BIRADS 1, density c. 5/10/124 MR breast BIRADS 1 Last DXA: never Last colonoscopy: 11/13/22 follow up 5 year   Exercising: Yes. Walking dog daily and strength training 3 days a week.  Smoker:no  Flowsheet Row Office Visit from 05/08/2024 in [facility]  PHQ-2 Total Score 0    Flowsheet Row Office Visit from 12/26/2021 in [facility] Total Score 0     GYN HISTORY: 3/21 dx with pyometra. Ultrasound without other findings, office curettage with drainage of abscess, treated with antibiotics.   OB History  Gravida Para Term Preterm AB Living  0 0 0 0 0 0  SAB IAB Ectopic Multiple Live Births  0 0 0 0   Obstetric Comments  1 adopted son   Past Medical History:  Diagnosis Date   Allergy    Arthritis    left knee   Endometriosis     Infertility, female    IVF 12 years ago   Migraine headache    topamax  for prevention at 75mg , imitrex  nasal spray 20 mg/act- up to 2 per day q2 hours   Vitamin D  deficiency    supplement through GYN   Past Surgical History:  Procedure Laterality Date   COLONOSCOPY     FOOT SURGERY     both feet   HYDROCELE EXCISION  1988   hernia, right lower abd   LAPAROSCOPY     removed left ovary   Current Outpatient Medications on File Prior to Visit  Medication Sig Dispense Refill   Acetaminophen (TYLENOL PO) Take by mouth as needed.     Cholecalciferol (VITAMIN D3 PO) Take 2,000 Int'l Units by mouth.      Cyanocobalamin (VITAMIN B-12 PO) Take by mouth.     No current facility-administered medications on file prior to visit.   Social History   Socioeconomic History   Marital status: Married    Spouse name: Not on file   Number of children: 1   Years of education: Not on file   Highest education level: Bachelor's degree (e.g., BA, AB, BS)  Occupational History   Occupation: Runner, broadcasting/film/video  Tobacco Use   Smoking status: Never    Passive exposure: Past   Smokeless tobacco: Never  Vaping Use   Vaping status: Never Used  Substance and Sexual Activity   Alcohol use: Yes  Alcohol/week: 0.0 - 2.0 standard drinks of alcohol    Comment: wine once a week   Drug use: No   Sexual activity: Yes    Partners: Male    Birth control/protection: None  Other Topics Concern   Not on file  Social History Narrative   Married. 1 adopted child 52 year old son (from [place]) in 2024- graduated from [employer]    Family friend of Dr. 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    Prior Had been  Midwife at [employer].       Hobbies: times outside, walking dog, time with friends and family, following sons sports   Social Drivers of Corporate investment banker Strain: Low Risk   (05/03/2023)   Overall Financial Resource Strain (CARDIA)    Difficulty of Paying Living Expenses: Not hard at all  Food Insecurity: No Food Insecurity (05/03/2023)   Hunger Vital Sign    Worried About Running Out of Food in the Last Year: Never true    Ran Out of Food in the Last Year: Never true  Transportation Needs: No Transportation Needs (05/03/2023)   PRAPARE - Administrator, Civil Service (Medical): No    Lack of Transportation (Non-Medical): No  Physical Activity: Sufficiently Active (05/03/2023)   Exercise Vital Sign    Days of Exercise per Week: 7 days    Minutes of Exercise per Session: 30 min  Stress: Stress Concern Present (05/03/2023)   [employer] of Occupational Health - Occupational Stress Questionnaire    Feeling of Stress : To some extent  Social Connections: Socially Integrated (05/03/2023)   Social Connection and Isolation Panel    Frequency of Communication with Friends and Family: Three times a week    Frequency of Social Gatherings with Friends and Family: Once a week    Attends Religious Services: More than 4 times per year    Active Member of [employer] or Organizations: Yes    Attends Banker Meetings: 1 to 4 times per year    Marital Status: Married  Catering manager Violence: Not on file   Family History  Problem Relation Age of Onset   Breast cancer Mother 74       passed at 25   Prostate cancer Father 37       lived into 55s   Uterine cancer Sister 21       lives in [place]   Kidney cancer Sister 12   Depression Brother        suicide   Colon cancer Neg Hx    Colon polyps Neg Hx    Crohn's disease Neg Hx    Esophageal cancer Neg Hx    Rectal cancer Neg Hx    Stomach cancer Neg Hx    Ulcerative colitis Neg Hx    No Known Allergies    PE Today's Vitals   05/08/24 0804  BP: 110/78  Pulse: 60  Temp: 97.9 F (36.6 C)  TempSrc: Oral  SpO2: 98%  Weight: 197 lb (89.4 kg)  Height: 5' 5.25 (1.657 m)   Body mass  index is 32.53 kg/m.  Physical Exam Vitals reviewed. Exam conducted with a chaperone present.  Constitutional:      General: She is not in acute distress.    Appearance: Normal appearance.  HENT:  Head: Normocephalic and atraumatic.     Nose: Nose normal.   Eyes:     Extraocular Movements: Extraocular movements intact.     Conjunctiva/sclera: Conjunctivae normal.   Neck:     Thyroid : No thyroid  mass, thyromegaly or thyroid  tenderness.  Pulmonary:     Effort: Pulmonary effort is normal.  Chest:     Chest wall: No mass or tenderness.  Breasts:    Right: Normal. No swelling, mass, nipple discharge, skin change or tenderness.     Left: Normal. No swelling, mass, nipple discharge, skin change or tenderness.  Abdominal:     General: There is no distension.     Palpations: Abdomen is soft.     Tenderness: There is no abdominal tenderness.  Genitourinary:    General: Normal vulva.     Exam position: Lithotomy position.     Urethra: No prolapse.     Vagina: Normal. No vaginal discharge or bleeding.     Cervix: Normal. No lesion.     Uterus: Normal. Not enlarged and not tender.      Adnexa: Right adnexa normal and left adnexa normal.      Comments: Hypopigmented region of labia minora  Musculoskeletal:        General: Normal range of motion.     Cervical back: Normal range of motion.  Lymphadenopathy:     Upper Body:     Right upper body: No axillary adenopathy.     Left upper body: No axillary adenopathy.     Lower Body: No right inguinal adenopathy. No left inguinal adenopathy.   Skin:    General: Skin is warm and dry.   Neurological:     General: No focal deficit present.     Mental Status: She is alert.   Psychiatric:        Mood and Affect: Mood normal.        Behavior: Behavior normal.       Assessment and Plan:        Well woman exam with routine gynecological exam Assessment & Plan: Cervical cancer screening performed according to ASCCP  guidelines. Encouraged annual mammogram screening Colonoscopy UTD DXA N/A Labs and immunizations with her primary Encouraged safe sexual practices as indicated Encouraged healthy lifestyle practices with diet and exercise For patients under 50-70yo, I recommend 1200mg  calcium daily and 600IU of vitamin D  daily.    Negative depression screening  Vasomotor symptoms due to menopause Assessment & Plan: Discussed nonhormonal options for VMS, including SSRIs, veozah, gabapetin. SSRIs can have side effects such as hypotension, mood irritability, GI upset, weight gain, sexual dysfunction, and drowsiness. Suicidal ideation can happpen in a subset of patient, and we reviewed that the medication would need to be stopped for this reason. Veozah is contraindicated in patient with liver dysfunction and required q3 months CMP side effects include abdominal pain, diarrhea, back pain, and insomnia. Gabapentin side effects such as dizziness, drowsiness, mood irritability/lability, GI upset, and rash. Suicidal ideation can happpen in a subset of patient, and we reviewed that the medication would need to be stopped for this reason. Patient elects for veozah. Will plan for gabapentin if not covered.   Orders: -     Comprehensive metabolic panel with GFR -     Veozah; Take 1 tablet (45 mg total) by mouth daily.  Dispense: 90 tablet; Refill: 3  Family history of breast cancer Assessment & Plan: TC risk ~19%. Baseline MR breast was completed last year and covered by insurance. Plan  for repeat every 2 to 3 years.   Lichen sclerosus et atrophicus Assessment & Plan: Asymptomatic Reviewed with patient that lichen sclerosus is a chronic inflammatory condition of the skin that carries an increased risk of squamous cell carcinoma. Initial treatment is with chronic topical steroid.  Recommend daily clobetasol for 3 months, followed by reassessment of the genitourinary area. Return office in 3  months.   Orders: -     Clobetasol Propionate; Apply 1 Application topically daily. Apply to labia minora.  Dispense: 60 g; Refill: 6   Iren Whipp Hadassah Letters, MD

## 2024-05-08 NOTE — Telephone Encounter (Signed)
 A PA has been started for Veozah 45 MG tablet on [email or web address]. as of today.  KEY: [id number]  [employer] has not yet replied to your PA request. Depending on the information you've provided, additional questions may be returned by the plan. You may close this dialog, return to your dashboard, and perform other tasks.  To check for an update later, open this request again from your dashboard.  If [employer] has not replied to your request within 24 hours please contact [employer] at [phone].

## 2024-05-08 NOTE — Assessment & Plan Note (Signed)
 TC risk ~19%. Baseline MR breast was completed last year and covered by insurance. Plan for repeat every 2 to 3 years.

## 2024-05-08 NOTE — Patient Instructions (Signed)

## 2024-05-09 ENCOUNTER — Ambulatory Visit: Payer: Self-pay | Admitting: Obstetrics and Gynecology

## 2024-05-09 NOTE — Telephone Encounter (Signed)
 Received fax from [facility] [employer] regarding Prior Authorization on 05/08/24. Veozah  is not covered under members plan.   Left voice message for patient to call back if she would like to proceed with alternative medication - gabapentin.

## 2024-08-07 ENCOUNTER — Ambulatory Visit: Admitting: Obstetrics and Gynecology

## 2025-05-13 ENCOUNTER — Ambulatory Visit: Admitting: Obstetrics and Gynecology
